# Patient Record
Sex: Male | Born: 1960 | Race: White | Hispanic: No | Marital: Married | Smoking: Current every day smoker
Health system: Southern US, Community
[De-identification: ages and names within clinical notes are randomized; demographics above are authoritative.]

## PROBLEM LIST (undated history)

## (undated) DIAGNOSIS — Z9289 Personal history of other medical treatment: Secondary | ICD-10-CM

## (undated) DIAGNOSIS — Z87442 Personal history of urinary calculi: Secondary | ICD-10-CM

---

## 1997-08-09 HISTORY — PX: HAND SURGERY: SHX662

## 1999-06-02 ENCOUNTER — Ambulatory Visit (HOSPITAL_BASED_OUTPATIENT_CLINIC_OR_DEPARTMENT_OTHER): Admission: RE | Admit: 1999-06-02 | Discharge: 1999-06-02 | Payer: Self-pay | Admitting: Orthopedic Surgery

## 2006-06-27 ENCOUNTER — Encounter: Admission: RE | Admit: 2006-06-27 | Discharge: 2006-06-27 | Payer: Self-pay | Admitting: Family Medicine

## 2008-12-18 ENCOUNTER — Encounter: Admission: RE | Admit: 2008-12-18 | Discharge: 2008-12-18 | Payer: Self-pay | Admitting: Family Medicine

## 2013-03-26 ENCOUNTER — Ambulatory Visit (INDEPENDENT_AMBULATORY_CARE_PROVIDER_SITE_OTHER): Payer: BC Managed Care – PPO | Admitting: Family Medicine

## 2013-03-26 VITALS — BP 120/78 | HR 86 | Temp 98.0°F | Resp 16 | Ht 69.0 in | Wt 135.0 lb

## 2013-03-26 DIAGNOSIS — M7710 Lateral epicondylitis, unspecified elbow: Secondary | ICD-10-CM

## 2013-03-26 DIAGNOSIS — M7712 Lateral epicondylitis, left elbow: Secondary | ICD-10-CM

## 2013-03-26 DIAGNOSIS — M25529 Pain in unspecified elbow: Secondary | ICD-10-CM

## 2013-03-26 MED ORDER — PREDNISONE 20 MG PO TABS: ORAL_TABLET | ORAL | Status: DC

## 2013-03-26 MED ORDER — METHYLPREDNISOLONE ACETATE 80 MG/ML IJ SUSP
80.0000 mg | Freq: Once | INTRAMUSCULAR | Status: AC
  Administered 2013-03-26: 80 mg via INTRA_ARTICULAR

## 2013-03-26 NOTE — Progress Notes (Signed)
51 [employer], left elbow pain worsening over lateral epicondyle for about10 days.  No trauma.  No change in activity.  No heavy lifting.  Aleve did not help  Objective:  NAD Mildly tender left lateral epicondyle.  No deformity.  Nl ROM Left elbow prepped with betadine and injected with 1 cc marcaine and 1/2 cc depomedrol.  Good relief.  No complications Assessment:  Lateral epicondylitis  Plan:  Tennis elbow syndrome, left - Plan: predniSONE (DELTASONE) 20 MG tablet, methylPREDNISolone acetate (DEPO-MEDROL) injection 80 mg  Signed, Elvina Sidle, MD

## 2013-03-26 NOTE — Patient Instructions (Addendum)
Tennis Elbow  Your caregiver has diagnosed you with a condition often referred to as "tennis elbow." This results from small tears or soreness (inflammation) at the start (origin) of the extensor muscles of the forearm. Although the condition is often called tennis or golfer's elbow, it is caused by any repetitive action performed by your elbow.  HOME CARE INSTRUCTIONS   If the condition has been short lived, rest may be the only treatment required. Using your opposite hand or arm to perform the task may help. Even changing your grip may help rest the extremity. These may even prevent the condition from recurring.   Longer standing problems, however, will often be relieved faster by:   Using anti-inflammatory agents.   Applying ice packs for 30 minutes at the end of the working day, at bed time, or when activities are finished.   Your caregiver may also have you wear a splint or sling. This will allow the inflamed tendon to heal.  At times, steroid injections aided with a local anesthetic will be required along with splinting for 1 to 2 weeks. Two to three steroid injections will often solve the problem. In some long standing cases, the inflamed tendon does not respond to conservative (non-surgical) therapy. Then surgery may be required to repair it.  MAKE SURE YOU:    Understand these instructions.   Will watch your condition.   Will get help right away if you are not doing well or get worse.  Document Released: 07/26/2005 Document Revised: 10/18/2011 Document Reviewed: 03/13/2008  [employer] Patient Information 2014 [employer].

## 2013-03-29 ENCOUNTER — Telehealth: Payer: Self-pay | Admitting: Radiology

## 2013-03-29 NOTE — Telephone Encounter (Signed)
erroneous encoutner

## 2017-05-19 ENCOUNTER — Encounter (HOSPITAL_COMMUNITY): Payer: Self-pay | Admitting: Emergency Medicine

## 2017-05-19 ENCOUNTER — Emergency Department (HOSPITAL_COMMUNITY)
Admission: EM | Admit: 2017-05-19 | Discharge: 2017-05-19 | Disposition: A | Discharge status: Home / Self Care | Payer: BLUE CROSS/BLUE SHIELD | Attending: Emergency Medicine | Admitting: Emergency Medicine

## 2017-05-19 ENCOUNTER — Emergency Department (HOSPITAL_COMMUNITY): Payer: BLUE CROSS/BLUE SHIELD

## 2017-05-19 DIAGNOSIS — F17200 Nicotine dependence, unspecified, uncomplicated: Secondary | ICD-10-CM | POA: Diagnosis not present

## 2017-05-19 DIAGNOSIS — N200 Calculus of kidney: Secondary | ICD-10-CM | POA: Insufficient documentation

## 2017-05-19 DIAGNOSIS — N3000 Acute cystitis without hematuria: Secondary | ICD-10-CM | POA: Insufficient documentation

## 2017-05-19 DIAGNOSIS — R109 Unspecified abdominal pain: Secondary | ICD-10-CM | POA: Diagnosis present

## 2017-05-19 LAB — CBC
HCT: 40.7 % (ref 39.0–52.0)
Hemoglobin: 14.2 g/dL (ref 13.0–17.0)
MCH: 31.9 pg (ref 26.0–34.0)
MCHC: 34.9 g/dL (ref 30.0–36.0)
MCV: 91.5 fL (ref 78.0–100.0)
PLATELETS: 155 10*3/uL (ref 150–400)
RBC: 4.45 MIL/uL (ref 4.22–5.81)
RDW: 13.7 % (ref 11.5–15.5)
WBC: 15 10*3/uL — ABNORMAL HIGH (ref 4.0–10.5)

## 2017-05-19 LAB — URINALYSIS, ROUTINE W REFLEX MICROSCOPIC
BILIRUBIN URINE: NEGATIVE
Glucose, UA: NEGATIVE mg/dL
Ketones, ur: NEGATIVE mg/dL
NITRITE: NEGATIVE
PH: 6 (ref 5.0–8.0)
Protein, ur: 100 mg/dL — AB
SPECIFIC GRAVITY, URINE: 1.011 (ref 1.005–1.030)

## 2017-05-19 LAB — BASIC METABOLIC PANEL
Anion gap: 12 (ref 5–15)
BUN: 24 mg/dL — AB (ref 6–20)
CALCIUM: 9.1 mg/dL (ref 8.9–10.3)
CO2: 26 mmol/L (ref 22–32)
CREATININE: 1.85 mg/dL — AB (ref 0.61–1.24)
Chloride: 99 mmol/L — ABNORMAL LOW (ref 101–111)
GFR calc Af Amer: 45 mL/min — ABNORMAL LOW (ref >60)
GFR, EST NON AFRICAN AMERICAN: 39 mL/min — AB (ref >60)
GLUCOSE: 122 mg/dL — AB (ref 65–99)
Potassium: 4 mmol/L (ref 3.5–5.1)
Sodium: 137 mmol/L (ref 135–145)

## 2017-05-19 MED ORDER — SODIUM CHLORIDE 0.9 % IV SOLN: INTRAVENOUS | Status: DC

## 2017-05-19 MED ORDER — CEFTRIAXONE SODIUM 1 G IJ SOLR
1.0000 g | Freq: Once | INTRAMUSCULAR | Status: AC
  Administered 2017-05-19: 1 g via INTRAVENOUS
  Filled 2017-05-19: qty 10

## 2017-05-19 MED ORDER — KETOROLAC TROMETHAMINE 30 MG/ML IJ SOLN
15.0000 mg | Freq: Once | INTRAMUSCULAR | Status: AC
  Administered 2017-05-19: 15 mg via INTRAVENOUS
  Filled 2017-05-19: qty 1

## 2017-05-19 NOTE — ED Triage Notes (Signed)
Pt complaint of hematuria and left flank pain onset Monday.

## 2017-05-19 NOTE — ED Provider Notes (Signed)
[facility]-EMERGENCY DEPT Provider Note   CSN: [id number] Arrival date & time: 05/19/17  1725     History   Chief Complaint Chief Complaint  Patient presents with  . Flank Pain    HPI Frederick Duke is a 56 y.o. male.  HPI Patient presents to the emergency room with complaints of left flank pain. Patient states symptoms started on Monday. He went to needle outpatient clinic and was told he had some blood in the urine as well as possibly some infection.  He was started on pain medications and antibiotics. Patient states his symptoms have persisted. He continues to have sharp pain in the left flank. It does not radiate. He's had some nausea but no vomiting. No abdominal pain. No testicular pain. No numbness or weakness. He does not have a history of kidney stones but thinks that could be what is going on. History reviewed. No pertinent past medical history.  There are no active problems to display for this patient.   Past Surgical History:  Procedure Laterality Date  . HAND SURGERY Right        Home Medications    Prior to Admission medications   Medication Sig Start Date End Date Taking? Authorizing Provider  ciprofloxacin (CIPRO) 500 MG tablet TK 1 T PO  BID 05/17/17  Yes [provider]  HYDROcodone-acetaminophen (NORCO/VICODIN) 5-325 MG tablet TK ONE T PO Q 6 H PRN PAIN 05/17/17  Yes [provider]  predniSONE (DELTASONE) 20 MG tablet 2 daily with food Patient not taking: Reported on 05/19/2017 03/26/13   Elvina Sidle, MD    Family History No family history on file.  Social History Social History  Substance Use Topics  . Smoking status: Current Every Day Smoker  . Smokeless tobacco: Not on file  . Alcohol use No     Allergies   Patient has no known allergies.   Review of Systems Review of Systems  All other systems reviewed and are negative.    Physical Exam Updated Vital Signs BP 130/90 (BP Location: Left Arm)   Pulse (!) 103    Temp 98 F (36.7 C) (Oral)   Resp 18   Ht 1.753 m ( )   Wt 59 kg (130 lb)   SpO2 96%   BMI 19.20 kg/m   Physical Exam  Constitutional: He appears well-developed and well-nourished. No distress.  HENT:  Head: Normocephalic and atraumatic.  Right Ear: External ear normal.  Left Ear: External ear normal.  Eyes: Conjunctivae are normal. Right eye exhibits no discharge. Left eye exhibits no discharge. No scleral icterus.  Neck: Neck supple. No tracheal deviation present.  Cardiovascular: Normal rate.   Pulmonary/Chest: Effort normal. No stridor. No respiratory distress.  Abdominal: Soft. Normal appearance. He exhibits no distension. There is no hepatosplenomegaly. There is no tenderness. There is CVA tenderness (left). No hernia.  Musculoskeletal: He exhibits no edema.  Neurological: He is alert. Cranial nerve deficit: no gross deficits.  Skin: Skin is warm and dry. No rash noted.  Psychiatric: He has a normal mood and affect.  Nursing note and vitals reviewed.    ED Treatments / Results  Labs (all labs ordered are listed, but only abnormal results are displayed) Labs Reviewed  URINALYSIS, ROUTINE W REFLEX MICROSCOPIC - Abnormal; Notable for the following:       Result Value   Hgb urine dipstick MODERATE (*)    Protein, ur 100 (*)    Leukocytes, UA LARGE (*)    Bacteria, UA  RARE (*)    Squamous Epithelial / LPF 0-5 (*)    All other components within normal limits  CBC - Abnormal; Notable for the following:    WBC 15.0 (*)    All other components within normal limits  BASIC METABOLIC PANEL - Abnormal; Notable for the following:    Chloride 99 (*)    Glucose, Bld 122 (*)    BUN 24 (*)    Creatinine, Ser 1.85 (*)    GFR calc non Af Amer 39 (*)    GFR calc Af Amer 45 (*)    All other components within normal limits    EKG  EKG Interpretation None       Radiology Ct Renal Stone Study  Result Date: 05/19/2017 CLINICAL DATA:  Pt complaint of hematuria and left  flank pain onset Monday EXAM: CT ABDOMEN AND PELVIS WITHOUT CONTRAST TECHNIQUE: Multidetector CT imaging of the abdomen and pelvis was performed following the standard protocol without IV contrast. COMPARISON:  None. FINDINGS: Lower chest: No acute abnormality. Hepatobiliary: Subcentimeter probable cyst in hepatic segment 7. No other liver lesion. Gallbladder is nondilated. No biliary ductal dilatation. Pancreas: Unremarkable. No pancreatic ductal dilatation or surrounding inflammatory changes. Spleen: Normal size.Scattered calcified granulomas. Adrenals/Urinary Tract: Normal adrenals. Moderate left hydronephrosis. 1.5 cm calculus at the left UPJ. The left ureter is ectatic distally. Bilateral nephrolithiasis. Urinary bladder is distended and yet mildly thick walled. Coarse calcification near the dome of the urinary bladder possibly associated with urachal remnant. Stomach/Bowel: Stomach and small bowel decompressed. Appendix not identified. The colon is unremarkable. Vascular/Lymphatic: No significant vascular findings are present. No enlarged abdominal or pelvic lymph nodes. Reproductive: Mild prostatic enlargement with central coarse calcifications. Other: No ascites.  No free air. Musculoskeletal: No acute or significant osseous findings. IMPRESSION: 1. Bilateral nephrolithiasis with obstructing 1.5 cm left UPJ calculus. 2. Distended thick-walled urinary bladder with distal left ureteral ectasia suggesting a degree of bladder outlet obstruction versus neurogenic bladder. Electronically Signed   By: Corlis Leak M.D.   On: 05/19/2017 18:52    Procedures Procedures (including critical care time)  Medications Ordered in ED Medications  ketorolac (TORADOL) 30 MG/ML injection 15 mg (not administered)  cefTRIAXone (ROCEPHIN) 1 g in dextrose 5 % 50 mL IVPB (not administered)     Initial Impression / Assessment and Plan / ED Course  I have reviewed the triage vital signs and the nursing notes.  Pertinent  labs & imaging results that were available during my care of the patient were reviewed by me and considered in my medical decision making (see chart for details).   Pt presented to the ED with persistent flank pain.  CT scan confirms a large UPJ stone.  Pt also has a uti but no fever or other systemic symptoms.  Clinically he appears well. Discussed with Dr Gearldine Shown.  Will have him follow up as an outpatient.  STrict return precautions given.  Final Clinical Impressions(s) / ED Diagnoses   Final diagnoses:  Acute cystitis without hematuria  Kidney stone    New Prescriptions New Prescriptions   No medications on file     Linwood Dibbles, MD 05/19/17 2012

## 2017-05-19 NOTE — Discharge Instructions (Signed)
Continue the antibiotics and medications for pain. He also can take ibuprofen or Naprosyn as needed. Call the urologist office to schedule appointment. The kidney stone that you have is unlikely to pass without additional treatment.

## 2017-05-20 ENCOUNTER — Observation Stay (HOSPITAL_COMMUNITY)
Admission: RE | Admit: 2017-05-20 | Discharge: 2017-05-21 | Disposition: A | Discharge status: Home / Self Care | Payer: BLUE CROSS/BLUE SHIELD | Attending: Urology | Admitting: Urology

## 2017-05-20 ENCOUNTER — Encounter (HOSPITAL_COMMUNITY): Admission: RE | Disposition: A | Discharge status: Home / Self Care | Payer: Self-pay | Attending: Urology

## 2017-05-20 ENCOUNTER — Ambulatory Visit (HOSPITAL_COMMUNITY): Payer: BLUE CROSS/BLUE SHIELD

## 2017-05-20 ENCOUNTER — Other Ambulatory Visit: Payer: Self-pay | Admitting: Urology

## 2017-05-20 ENCOUNTER — Ambulatory Visit (HOSPITAL_COMMUNITY): Payer: BLUE CROSS/BLUE SHIELD | Admitting: Anesthesiology

## 2017-05-20 ENCOUNTER — Observation Stay (HOSPITAL_COMMUNITY): Payer: BLUE CROSS/BLUE SHIELD

## 2017-05-20 ENCOUNTER — Encounter (HOSPITAL_COMMUNITY): Payer: Self-pay | Admitting: *Deleted

## 2017-05-20 DIAGNOSIS — N390 Urinary tract infection, site not specified: Secondary | ICD-10-CM | POA: Diagnosis not present

## 2017-05-20 DIAGNOSIS — F17210 Nicotine dependence, cigarettes, uncomplicated: Secondary | ICD-10-CM | POA: Insufficient documentation

## 2017-05-20 DIAGNOSIS — N132 Hydronephrosis with renal and ureteral calculous obstruction: Secondary | ICD-10-CM | POA: Diagnosis present

## 2017-05-20 DIAGNOSIS — N201 Calculus of ureter: Secondary | ICD-10-CM | POA: Diagnosis present

## 2017-05-20 HISTORY — DX: Personal history of urinary calculi: Z87.442

## 2017-05-20 HISTORY — PX: CYSTOSCOPY W/ URETERAL STENT PLACEMENT: SHX1429

## 2017-05-20 SURGERY — CYSTOSCOPY, FLEXIBLE, WITH STENT REPLACEMENT
Anesthesia: General | Laterality: Left

## 2017-05-20 MED ORDER — ONDANSETRON HCL 4 MG/2ML IJ SOLN
INTRAMUSCULAR | Status: AC
  Filled 2017-05-20: qty 2

## 2017-05-20 MED ORDER — DEXTROSE 5 % IV SOLN: 1.0000 g | INTRAVENOUS | Status: DC

## 2017-05-20 MED ORDER — FENTANYL CITRATE (PF) 100 MCG/2ML IJ SOLN: 25.0000 ug | INTRAMUSCULAR | Status: DC | PRN

## 2017-05-20 MED ORDER — ONDANSETRON HCL 4 MG/2ML IJ SOLN: 4.0000 mg | INTRAMUSCULAR | Status: DC | PRN

## 2017-05-20 MED ORDER — PHENYLEPHRINE 40 MCG/ML (10ML) SYRINGE FOR IV PUSH (FOR BLOOD PRESSURE SUPPORT)
PREFILLED_SYRINGE | INTRAVENOUS | Status: DC | PRN
  Administered 2017-05-20 (×3): 80 ug via INTRAVENOUS

## 2017-05-20 MED ORDER — MIDAZOLAM HCL 2 MG/2ML IJ SOLN
INTRAMUSCULAR | Status: AC
  Filled 2017-05-20: qty 2

## 2017-05-20 MED ORDER — DEXTROSE 5 % IV SOLN
2.0000 g | Freq: Once | INTRAVENOUS | Status: AC
  Administered 2017-05-20: 2 g via INTRAVENOUS
  Filled 2017-05-20: qty 2

## 2017-05-20 MED ORDER — PROPOFOL 10 MG/ML IV BOLUS
INTRAVENOUS | Status: AC
  Filled 2017-05-20: qty 20

## 2017-05-20 MED ORDER — FENTANYL CITRATE (PF) 100 MCG/2ML IJ SOLN
INTRAMUSCULAR | Status: AC
  Filled 2017-05-20: qty 2

## 2017-05-20 MED ORDER — DEXAMETHASONE SODIUM PHOSPHATE 10 MG/ML IJ SOLN
INTRAMUSCULAR | Status: DC | PRN
  Administered 2017-05-20: 10 mg via INTRAVENOUS

## 2017-05-20 MED ORDER — HYDROCODONE-ACETAMINOPHEN 5-325 MG PO TABS: 1.0000 | ORAL_TABLET | ORAL | Status: DC | PRN

## 2017-05-20 MED ORDER — LACTATED RINGERS IV SOLN
INTRAVENOUS | Status: DC
  Administered 2017-05-20: 18:00:00 via INTRAVENOUS

## 2017-05-20 MED ORDER — ONDANSETRON HCL 4 MG/2ML IJ SOLN
INTRAMUSCULAR | Status: DC | PRN
  Administered 2017-05-20: 4 mg via INTRAVENOUS

## 2017-05-20 MED ORDER — IOHEXOL 300 MG/ML  SOLN
INTRAMUSCULAR | Status: DC | PRN
  Administered 2017-05-20: 5 mL via URETHRAL

## 2017-05-20 MED ORDER — DIPHENHYDRAMINE HCL 12.5 MG/5ML PO ELIX: 12.5000 mg | ORAL_SOLUTION | Freq: Four times a day (QID) | ORAL | Status: DC | PRN

## 2017-05-20 MED ORDER — DEXAMETHASONE SODIUM PHOSPHATE 10 MG/ML IJ SOLN
INTRAMUSCULAR | Status: AC
  Filled 2017-05-20: qty 1

## 2017-05-20 MED ORDER — SODIUM CHLORIDE 0.9 % IR SOLN
Status: DC | PRN
  Administered 2017-05-20: 3000 mL

## 2017-05-20 MED ORDER — FENTANYL CITRATE (PF) 100 MCG/2ML IJ SOLN
INTRAMUSCULAR | Status: DC | PRN
  Administered 2017-05-20: 100 ug via INTRAVENOUS

## 2017-05-20 MED ORDER — DOCUSATE SODIUM 100 MG PO CAPS
100.0000 mg | ORAL_CAPSULE | Freq: Two times a day (BID) | ORAL | Status: DC
Start: 2017-05-20 — End: 2017-05-21
  Administered 2017-05-20: 100 mg via ORAL
  Filled 2017-05-20: qty 1

## 2017-05-20 MED ORDER — KCL IN DEXTROSE-NACL 20-5-0.45 MEQ/L-%-% IV SOLN
INTRAVENOUS | Status: DC
  Administered 2017-05-20: 20:00:00 via INTRAVENOUS
  Filled 2017-05-20 (×2): qty 1000

## 2017-05-20 MED ORDER — LIDOCAINE 2% (20 MG/ML) 5 ML SYRINGE
INTRAMUSCULAR | Status: DC | PRN
  Administered 2017-05-20: 100 mg via INTRAVENOUS

## 2017-05-20 MED ORDER — PROPOFOL 10 MG/ML IV BOLUS
INTRAVENOUS | Status: DC | PRN
  Administered 2017-05-20: 180 mg via INTRAVENOUS

## 2017-05-20 MED ORDER — PHENYLEPHRINE 40 MCG/ML (10ML) SYRINGE FOR IV PUSH (FOR BLOOD PRESSURE SUPPORT)
PREFILLED_SYRINGE | INTRAVENOUS | Status: AC
  Filled 2017-05-20: qty 10

## 2017-05-20 MED ORDER — LIDOCAINE 2% (20 MG/ML) 5 ML SYRINGE
INTRAMUSCULAR | Status: AC
  Filled 2017-05-20: qty 5

## 2017-05-20 MED ORDER — DIPHENHYDRAMINE HCL 50 MG/ML IJ SOLN: 12.5000 mg | Freq: Four times a day (QID) | INTRAMUSCULAR | Status: DC | PRN

## 2017-05-20 MED ORDER — MIDAZOLAM HCL 5 MG/5ML IJ SOLN
INTRAMUSCULAR | Status: DC | PRN
  Administered 2017-05-20: 2 mg via INTRAVENOUS

## 2017-05-20 SURGICAL SUPPLY — 13 items
BAG URO CATCHER STRL LF (MISCELLANEOUS) ×3 IMPLANT
CATH INTERMIT  6FR 70CM (CATHETERS) ×3 IMPLANT
CLOTH BEACON ORANGE TIMEOUT ST (SAFETY) ×3 IMPLANT
COVER FOOTSWITCH UNIV (MISCELLANEOUS) IMPLANT
COVER SURGICAL LIGHT HANDLE (MISCELLANEOUS) ×3 IMPLANT
GLOVE BIOGEL M STRL SZ7.5 (GLOVE) ×7 IMPLANT
GOWN STRL REUS W/TWL LRG LVL3 (GOWN DISPOSABLE) ×6 IMPLANT
GUIDEWIRE STR DUAL SENSOR (WIRE) ×3 IMPLANT
MANIFOLD NEPTUNE II (INSTRUMENTS) ×3 IMPLANT
PACK CYSTO (CUSTOM PROCEDURE TRAY) ×3 IMPLANT
STENT CONTOUR 6FRX24X.038 (STENTS) ×2 IMPLANT
TUBING CONNECTING 10 (TUBING) ×2 IMPLANT
TUBING CONNECTING 10' (TUBING) ×1

## 2017-05-20 NOTE — Transfer of Care (Signed)
Immediate Anesthesia Transfer of Care Note  Patient: Frederick Duke  Procedure(s) Performed: Procedure(s): CYSTOSCOPY WITH STENT REPLACEMENT, RETROGRADE (Left)  Patient Location: PACU  Anesthesia Type:General  Level of Consciousness:  alert, patient cooperative and responds to stimulation  Airway & Oxygen Therapy:Patient Spontanous Breathing and Patient connected to face mask oxgen  Post-op Assessment:  Report given to PACU RN and Post -op Vital signs reviewed and stable  Post vital signs:  Reviewed and stable  Last Vitals:  Vitals:   05/20/17 1727  BP: 126/87  Pulse: (!) 103  Resp: 16  Temp: 37.1 C  SpO2: 99%    Complications: No apparent anesthesia complications

## 2017-05-20 NOTE — Anesthesia Preprocedure Evaluation (Addendum)
Anesthesia Evaluation  Patient identified by MRN, date of birth, ID band Patient awake    Reviewed: Allergy & Precautions, H&P , NPO status , Patient's Chart, lab work & pertinent test results  Airway Mallampati: II  TM Distance: >3 FB Neck ROM: Full    Dental no notable dental hx. (+) Poor Dentition, Dental Advisory Given   Pulmonary Current Smoker,    Pulmonary exam normal breath sounds clear to auscultation       Cardiovascular negative cardio ROS   Rhythm:Regular Rate:Normal     Neuro/Psych negative neurological ROS  negative psych ROS   GI/Hepatic negative GI ROS, Neg liver ROS,   Endo/Other  negative endocrine ROS  Renal/GU negative Renal ROS  negative genitourinary   Musculoskeletal   Abdominal   Peds  Hematology negative hematology ROS (+)   Anesthesia Other Findings   Reproductive/Obstetrics negative OB ROS                            Anesthesia Physical Anesthesia Plan  ASA: II  Anesthesia Plan: General   Post-op Pain Management:    Induction: Intravenous  PONV Risk Score and Plan: 2 and Ondansetron, Dexamethasone and Midazolam  Airway Management Planned: LMA  Additional Equipment:   Intra-op Plan:   Post-operative Plan: Extubation in OR  Informed Consent: I have reviewed the patients History and Physical, chart, labs and discussed the procedure including the risks, benefits and alternatives for the proposed anesthesia with the patient or authorized representative who has indicated his/her understanding and acceptance.   Dental advisory given  Plan Discussed with: CRNA  Anesthesia Plan Comments:         Anesthesia Quick Evaluation

## 2017-05-20 NOTE — H&P (Signed)
Office Visit Report     05/20/2017   --------------------------------------------------------------------------------   Frederick Duke  MRN: [id number]  PRIMARY CARE:    DOB: Aug 06, 1961, 56 year old Male  REFERRING:  Linwood Dibbles, MD  SSN: [id number]  PROVIDER:  Heloise Purpura, M.D.    LOCATION:  [facility] [phone]   --------------------------------------------------------------------------------   CC/HPI: Left ureteral and renal calculi   [name] is an otherwise healthy 56 year old gentleman who developed the acute onset of severe left-sided flank pain beginning on Monday. He was seen by his primary care office on Tuesday and was administered pain medication and ciprofloxacin for presumed UTI and possible kidney stone. He has since had some low-grade fevers up to 99.9. He has had associated nausea and vomiting and his pain has been intermittent. His pain worsened on Thursday causing him to present to the emergency department where he underwent a CT stone study. This confirmed multiple nonobstructing left renal calculi along with a 1.5 cm left UPJ calculus. I reviewed the CT scan. Hounsfield units measure 750. He has no prior history of urolithiasis. His white blood count in the emergency department was 15,000. His serum creatinine was 1.85. Urinalysis demonstrated rare bacteria.     ALLERGIES: None   MEDICATIONS: Cipro PO Daily  Hydrocodone-Acetaminophen     GU PSH: None   NON-GU PSH: Hand/finger Surgery, Right    GU PMH: None   NON-GU PMH: None   FAMILY HISTORY: prostate - Father   SOCIAL HISTORY: Marital Status: Married Preferred Language: English; Ethnicity: Not Hispanic Or Latino; Race: White Current Smoking Status: Patient does not smoke anymore. Smoked for 35 years. Smoked 1 pack per day.   Tobacco Use Assessment Completed: Used Tobacco in last 30 days? Has never drank.  Drinks 2 caffeinated drinks per day.    REVIEW OF SYSTEMS:    GU  Review Male:   Patient denies frequent urination, hard to postpone urination, burning/ pain with urination, get up at night to urinate, leakage of urine, stream starts and stops, trouble starting your streams, and have to strain to urinate .  Gastrointestinal (Upper):   Patient reports vomiting. Patient denies nausea.  Gastrointestinal (Lower):   Patient denies diarrhea and constipation.  Constitutional:   Patient reports fever, night sweats, weight loss, and fatigue.   Skin:   Patient denies skin rash/ lesion and itching.  Eyes:   Patient denies blurred vision and double vision.  Ears/ Nose/ Throat:   Patient denies sore throat and sinus problems.  Hematologic/Lymphatic:   Patient denies swollen glands and easy bruising.  Cardiovascular:   Patient denies leg swelling and chest pains.  Respiratory:   Patient denies cough and shortness of breath.  Endocrine:   Patient denies excessive thirst.  Musculoskeletal:   Patient reports back pain. Patient denies joint pain.  Neurological:   Patient denies headaches and dizziness.  Psychologic:   Patient denies depression and anxiety.   VITAL SIGNS:      05/20/2017 03:58 PM  Weight 130 lb / 58.97 kg  Height 69 in / 175.26 cm  BP 119/76 mmHg  Pulse 105 /min  Temperature 99.1 F / 37.2 C  BMI 19.2 kg/m   MULTI-SYSTEM PHYSICAL EXAMINATION:    Constitutional: Well-nourished. No physical deformities. Normally developed. Good grooming.  Neck: Neck symmetrical, not swollen. Normal tracheal position.  Respiratory: No labored breathing, no use of accessory muscles. Clear bilaterally.  Cardiovascular: Normal temperature, normal extremity pulses, no swelling, no varicosities. Regular rate  and rhythm.  Lymphatic: No enlargement of neck, axillae, groin.  Skin: No paleness, no jaundice, no cyanosis. No lesion, no ulcer, no rash.  Neurologic / Psychiatric: Oriented to time, oriented to place, oriented to person. No depression, no anxiety, no agitation.   Gastrointestinal: No mass, no tenderness, no rigidity, non obese abdomen. No CVA tenderness.  Eyes: Normal conjunctivae. Normal eyelids.  Ears, Nose, Mouth, and Throat: Left ear no scars, no lesions, no masses. Right ear no scars, no lesions, no masses. Nose no scars, no lesions, no masses. Normal hearing. Normal lips.  Musculoskeletal: Normal gait and station of head and neck.     PAST DATA REVIEWED:  Source Of History:  Patient  Lab Test Review:   BMP, CBC with Diff  Records Review:   Previous Patient Records  Urine Test Review:   Urinalysis  X-Ray Review: C.T. Abdomen/Pelvis: Reviewed Films.    Notes:                     Creatinine 1.85   PROCEDURES:          Urinalysis w/Scope Dipstick Dipstick Cont'd Micro  Color: Amber Bilirubin: Neg WBC/hpf: >60/hpf  Appearance: Cloudy Ketones: Trace RBC/hpf: 10 - 20/hpf  Specific Gravity: 1.025 Blood: 2+ Bacteria: Many (>50/hpf)  pH: 6.0 Protein: 2+ Cystals: NS (Not Seen)  Glucose: Neg Urobilinogen: 0.2 Casts: NS (Not Seen)    Nitrites: Neg Trichomonas: Not Present    Leukocyte Esterase: 3+ Mucous: Not Present      Epithelial Cells: 0 - 5/hpf      Yeast: NS (Not Seen)      Sperm: Not Present    ASSESSMENT:      ICD-10 Details  1 GU:   Ureteral calculus - N20.1    PLAN:           Orders Labs Urine Culture          Schedule Return Visit/Planned Activity: Other See Visit Notes             Note: Follow-up will be scheduled per surgery scheduler's following procedure.          Document Letter(s):  Created for Patient: Clinical Summary         Notes:   1. Large left UPJ calculus with multiple nonobstructing left renal calculi and UTI: I recommended proceeding with cystoscopy and ureteral stent placement this evening considering his leukocytosis and urinalysis that appears infected. He will be treated with antibiotic therapy and admitted for overnight observation. His urine has been cultured today. He will then need definitive  treatment of his left UPJ calculus and we will further discuss appropriate options for treatment in the future. I will perform a KUB x-ray while he is in the hospital. We discussed this plan procedure in detail including potential risks, complications, and expected recovery process. He gives informed consent to proceed.   Cc:

## 2017-05-20 NOTE — Anesthesia Postprocedure Evaluation (Signed)
Anesthesia Post Note  Patient: Frederick Duke  Procedure(s) Performed: CYSTOSCOPY WITH STENT REPLACEMENT, RETROGRADE (Left )     Patient location during evaluation: PACU Anesthesia Type: General Level of consciousness: awake and alert Pain management: pain level controlled Vital Signs Assessment: post-procedure vital signs reviewed and stable Respiratory status: spontaneous breathing, nonlabored ventilation, respiratory function stable and patient connected to nasal cannula oxygen Cardiovascular status: blood pressure returned to baseline and stable Postop Assessment: no apparent nausea or vomiting Anesthetic complications: no    Last Vitals:  Vitals:   05/20/17 1915 05/20/17 1930  BP: 104/76 104/72  Pulse: 78 84  Resp: 15 15  Temp:  36.6 C  SpO2: 100% 100%    Last Pain:  Vitals:   05/20/17 1727  TempSrc: Oral                 Camesha Farooq,W. EDMOND

## 2017-05-20 NOTE — Op Note (Signed)
Preoperative diagnosis:  1. Left ureteral stone and UTI   Postoperative diagnosis:  1. Left ureteral stone and UTI   Procedure:  1. Cystoscopy 2. Left ureteral stent placement (6 x 24) no string 3. Left retrograde pyelography with interpretation   Surgeon: Moody Bruins. M.D.  Anesthesia: General  Complications: None  Intraoperative findings: Left retrograde pyelography was performed with a 6 Fr ureteral cathter and omnipaque contrast.  He was noted to have dilation of his renal collecting system and a filling defect of the proximal ureter and lower pole of the renal collecting system.  EBL: Minimal  Specimens: None  Indication: Frederick Duke is a 56 y.o. patient with a left ureteral stone and UTI. After reviewing the management options for treatment, he elected to proceed with the above surgical procedure(s). We have discussed the potential benefits and risks of the procedure, side effects of the proposed treatment, the likelihood of the patient achieving the goals of the procedure, and any potential problems that might occur during the procedure or recuperation. Informed consent has been obtained.  Description of procedure:  The patient was taken to the operating room and general anesthesia was induced.  The patient was placed in the dorsal lithotomy position, prepped and draped in the usual sterile fashion, and preoperative antibiotics were administered. A preoperative time-out was performed.   Cystourethroscopy was performed.  The patient's urethra was examined and was normal. The bladder was then systematically examined in its entirety. There was no evidence for any bladder tumors, stones, or other mucosal pathology.    Attention then turned to the left ureteral orifice and a ureteral catheter was used to intubate the ureteral orifice.  Omnipaque contrast was injected through the ureteral catheter and a retrograde pyelogram was performed with findings as dictated  above.  A 0.38 sensor guidewire was then advanced up the left ureter into the renal pelvis under fluoroscopic guidance.  The wire was then backloaded through the cystoscope and a ureteral stent was advance over the wire using Seldinger technique.  The stent was positioned appropriately under fluoroscopic and cystoscopic guidance.  The wire was then removed with an adequate stent curl noted in the renal pelvis as well as in the bladder.  The bladder was then emptied and the procedure ended.  The patient appeared to tolerate the procedure well and without complications.  The patient was able to be awakened and transferred to the recovery unit in satisfactory condition.    Moody Bruins MD

## 2017-05-20 NOTE — Anesthesia Procedure Notes (Signed)
Procedure Name: Intubation Date/Time: 05/20/2017 6:30 PM Performed by: Enriqueta Shutter D Pre-anesthesia Checklist: Patient identified, Emergency Drugs available, Suction available and Patient being monitored Patient Re-evaluated:Patient Re-evaluated prior to induction Oxygen Delivery Method: Circle system utilized Preoxygenation: Pre-oxygenation with 100% oxygen Induction Type: IV induction Ventilation: Mask ventilation without difficulty LMA: LMA inserted LMA Size: 4.0 Tube type: Oral Number of attempts: 1 Placement Confirmation: positive ETCO2 and breath sounds checked- equal and bilateral Tube secured with: Tape Dental Injury: Teeth and Oropharynx as per pre-operative assessment

## 2017-05-21 ENCOUNTER — Encounter (HOSPITAL_COMMUNITY): Payer: Self-pay | Admitting: Urology

## 2017-05-21 DIAGNOSIS — N132 Hydronephrosis with renal and ureteral calculous obstruction: Secondary | ICD-10-CM | POA: Diagnosis not present

## 2017-05-21 LAB — CBC
HEMATOCRIT: 38.2 % — AB (ref 39.0–52.0)
HEMOGLOBIN: 12.8 g/dL — AB (ref 13.0–17.0)
MCH: 30.8 pg (ref 26.0–34.0)
MCHC: 33.5 g/dL (ref 30.0–36.0)
MCV: 92 fL (ref 78.0–100.0)
Platelets: 179 10*3/uL (ref 150–400)
RBC: 4.15 MIL/uL — AB (ref 4.22–5.81)
RDW: 14 % (ref 11.5–15.5)
WBC: 7.3 10*3/uL (ref 4.0–10.5)

## 2017-05-21 LAB — BASIC METABOLIC PANEL
ANION GAP: 10 (ref 5–15)
BUN: 28 mg/dL — ABNORMAL HIGH (ref 6–20)
CALCIUM: 8.9 mg/dL (ref 8.9–10.3)
CO2: 23 mmol/L (ref 22–32)
CREATININE: 1.03 mg/dL (ref 0.61–1.24)
Chloride: 109 mmol/L (ref 101–111)
Glucose, Bld: 172 mg/dL — ABNORMAL HIGH (ref 65–99)
Potassium: 4.6 mmol/L (ref 3.5–5.1)
SODIUM: 142 mmol/L (ref 135–145)

## 2017-05-21 MED ORDER — SENNA 8.6 MG PO TABS: 1.0000 | ORAL_TABLET | Freq: Every day | ORAL | 0 refills | Status: AC

## 2017-05-21 MED ORDER — HYDROCODONE-ACETAMINOPHEN 5-325 MG PO TABS: 1.0000 | ORAL_TABLET | ORAL | 0 refills | Status: DC | PRN

## 2017-05-21 MED ORDER — TAMSULOSIN HCL 0.4 MG PO CAPS: 0.4000 mg | ORAL_CAPSULE | Freq: Every day | ORAL | 0 refills | Status: AC

## 2017-05-21 MED ORDER — DOCUSATE SODIUM 100 MG PO CAPS: 100.0000 mg | ORAL_CAPSULE | Freq: Two times a day (BID) | ORAL | 0 refills | Status: DC

## 2017-05-21 NOTE — Progress Notes (Signed)
Patient ID: Frederick Duke, male   DOB: 01/28/1961, 56 y.o.   MRN: [id number]  1 Day Post-Op Subjective: Pt doing well.  Minimal pain.  No fever overnight.  Objective: Vital signs in last 24 hours: Temp:  [97.9 F (36.6 C)-98.8 F (37.1 C)] 98 F (36.7 C) (10/13 0443) Pulse Rate:  [78-103] 80 (10/13 0443) Resp:  [14-16] 16 (10/13 0443) BP: (104-126)/(68-87) 105/68 (10/13 0443) SpO2:  [97 %-100 %] 97 % (10/13 0443) Weight:  [55.5 kg (122 lb 5.7 oz)-56.5 kg (124 lb 9.6 oz)] 55.5 kg (122 lb 5.7 oz) (10/12 1946)  Intake/Output from previous day: 10/12 0701 - 10/13 0700 In: 1431.3 [P.O.:240; I.V.:1191.3] Out: 475 [Urine:475] Intake/Output this shift: No intake/output data recorded.  Physical Exam:  General: Alert and oriented Abdomen: Soft, ND, No CVAT   Lab Results:  Recent Labs  05/19/17 1807 05/21/17 0522  HGB 14.2 12.8*  HCT 40.7 38.2*   CBC Latest Ref Rng & Units 05/21/2017 05/19/2017  WBC 4.0 - 10.5 K/uL 7.3 15.0(H)  Hemoglobin 13.0 - 17.0 g/dL 12.8(L) 14.2  Hematocrit 39.0 - 52.0 % 38.2(L) 40.7  Platelets 150 - 400 K/uL 179 155     BMET  Recent Labs  05/19/17 1807 05/21/17 0522  NA 137 142  K 4.0 4.6  CL 99* 109  CO2 26 23  GLUCOSE 122* 172*  BUN 24* 28*  CREATININE 1.85* 1.03  CALCIUM 9.1 8.9     Studies/Results: Abdomen 1 View (kub)  Result Date: 05/20/2017 CLINICAL DATA:  Status post cystoscopy. Known left ureteral stone. Initial encounter. EXAM: ABDOMEN - 1 VIEW COMPARISON:  CT of the abdomen and pelvis from 05/19/2017 FINDINGS: A left ureteral stent is noted overlying expected position. Residual contrast is noted at the left renal calyces. The known large left ureteral stone is not well characterized on this study. The visualized bowel gas pattern is grossly unremarkable. No acute osseous abnormalities are seen. IMPRESSION: Left ureteral stent overlies expected position. Residual contrast at the left renal calyces. Known large left ureteral  stone is not well characterized on this study. Electronically Signed   By: Roanna Raider M.D.   On: 05/20/2017 22:34   Dg C-arm 1-60 Min-no Report  Result Date: 05/20/2017 Fluoroscopy was utilized by the requesting physician.  No radiographic interpretation.   Ct Renal Stone Study  Result Date: 05/19/2017 CLINICAL DATA:  Pt complaint of hematuria and left flank pain onset Monday EXAM: CT ABDOMEN AND PELVIS WITHOUT CONTRAST TECHNIQUE: Multidetector CT imaging of the abdomen and pelvis was performed following the standard protocol without IV contrast. COMPARISON:  None. FINDINGS: Lower chest: No acute abnormality. Hepatobiliary: Subcentimeter probable cyst in hepatic segment 7. No other liver lesion. Gallbladder is nondilated. No biliary ductal dilatation. Pancreas: Unremarkable. No pancreatic ductal dilatation or surrounding inflammatory changes. Spleen: Normal size.Scattered calcified granulomas. Adrenals/Urinary Tract: Normal adrenals. Moderate left hydronephrosis. 1.5 cm calculus at the left UPJ. The left ureter is ectatic distally. Bilateral nephrolithiasis. Urinary bladder is distended and yet mildly thick walled. Coarse calcification near the dome of the urinary bladder possibly associated with urachal remnant. Stomach/Bowel: Stomach and small bowel decompressed. Appendix not identified. The colon is unremarkable. Vascular/Lymphatic: No significant vascular findings are present. No enlarged abdominal or pelvic lymph nodes. Reproductive: Mild prostatic enlargement with central coarse calcifications. Other: No ascites.  No free air. Musculoskeletal: No acute or significant osseous findings. IMPRESSION: 1. Bilateral nephrolithiasis with obstructing 1.5 cm left UPJ calculus. 2. Distended thick-walled urinary bladder with distal left ureteral  ectasia suggesting a degree of bladder outlet obstruction versus neurogenic bladder. Electronically Signed   By: Corlis Leak M.D.   On: 05/19/2017 18:52     Assessment/Plan: Left UPJ stone and renal calculi with UTI - Continue empiric ciprofloxacin pending cultures.  Will d/c home today.  Will arrange outpatient f/u.  Stones not well visualized on KUB.  Will need PCNL vs staged ureteroscopy most likely.   LOS: 0 days   Nyeema Want,LES 05/21/2017, 8:05 AM

## 2017-05-21 NOTE — Progress Notes (Signed)
Reviewed discharge information with patient and caregiver. Answered all questions. Patient/caregiver able to teach back medications and reasons to contact MD/911. Patient verbalizes importance of PCP follow up appointment. Patient voiding well and denies pain. Provided with urology office number for follow up appointment.  Earnest Conroy. Clelia Croft, RN

## 2017-05-23 NOTE — Discharge Summary (Signed)
  Date of admission: 05/20/2017  Date of discharge: 05/21/17  Admission diagnosis: Left Ureteral Calculus  Discharge diagnosis: Left Ureteral Calculus  History and Physical: For full details, please see admission history and physical. Briefly, Frederick Duke is a 56 y.o. year old patient with a large left UPJ calculus and left renal calculi. She did have evidence of a urinary tract infection and was therefore recommended to undergo ureteral stentingon 05/20/17 when he presented.   Hospital Course: He underwent left ureteral stent placement on 05/20/17.  He remained afebrile overnight and was discharged home on empiric ciprofloxacin.  Laboratory values:  Recent Labs  05/21/17 0522  HGB 12.8*  HCT 38.2*    Recent Labs  05/21/17 0522  CREATININE 1.03    Disposition: Home  Discharge medications:  Allergies as of 05/21/2017   No Known Allergies     Medication List    TAKE these medications   ciprofloxacin 500 MG tablet Commonly known as:  CIPRO take  by mouth twice daily   docusate sodium 100 MG capsule Commonly known as:  COLACE Take 1 capsule (100 mg total) by mouth 2 (two) times daily.   HYDROcodone-acetaminophen 5-325 MG tablet Commonly known as:  NORCO/VICODIN Take 1 tablet by mouth every 4 (four) hours as needed for moderate pain. What changed:  See the new instructions.   senna 8.6 MG Tabs tablet Commonly known as:  SENOKOT Take 1-2 tablets (8.6-17.2 mg total) by mouth at bedtime.   tamsulosin 0.4 MG Caps capsule Commonly known as:  FLOMAX Take 1 capsule (0.4 mg total) by mouth at bedtime.       Followup:  he will follow-up with me in the next 2 weeks for further evaluation to assess resolution of his infection. His stones were not easy to identify on KUB imaging. He likely will be best served with a percutaneous nephrostolithotomy or treatment considering his large size and large burden left renal calculi and left UPJ stone.

## 2017-06-07 ENCOUNTER — Other Ambulatory Visit (HOSPITAL_COMMUNITY): Payer: Self-pay | Admitting: Urology

## 2017-06-07 ENCOUNTER — Other Ambulatory Visit: Payer: Self-pay | Admitting: Urology

## 2017-06-07 DIAGNOSIS — N200 Calculus of kidney: Secondary | ICD-10-CM

## 2017-06-21 NOTE — Progress Notes (Addendum)
05-20-17 (EPIC) DG Abd/1 View  06-22-17 CBC result routed to Dr. Laverle PatterBorden for review

## 2017-06-21 NOTE — Patient Instructions (Addendum)
Frederick RobesJoseph L Duke  06/21/2017   Your procedure is scheduled on: 06-27-17  [facility]  Entrance to Radiology at 9:30 AM   Call this number if you have problems the morning of surgery [phone]    Remember: ONLY 1 PERSON MAY GO WITH YOU TO SHORT STAY TO GET  READY MORNING OF YOUR SURGERY.  Do not eat food or drink liquids :After Midnight. You may have a Clear Liquid Diet from Midnight until 7:00 AM. After 7:00 AM, nothing until after surgery.      CLEAR LIQUID DIET   Foods Allowed                                                                     Foods Excluded  Coffee and tea, regular and decaf                             liquids that you cannot  Plain Jell-O in any flavor                                             see through such as: Fruit ices (not with fruit pulp)                                     milk, soups, orange juice  Iced Popsicles                                    All solid food Carbonated beverages, regular and diet                                    Cranberry, grape and apple juices Sports drinks like Gatorade Lightly seasoned clear broth or consume(fat free) Sugar, honey syrup  Sample Menu Breakfast                                Lunch                                     Supper Cranberry juice                    Beef broth                            Chicken broth Jell-O                                     Grape juice  Apple juice Coffee or tea                        Jell-O                                      Popsicle                                                Coffee or tea                        Coffee or tea  _____________________________________________________________________    Take these medicines the morning of surgery with A SIP OF WATER: None                                You may not have any metal on your body including hair pins and              piercings  Do not wear  jewelry, lotions, powders , deodorant             Men may shave face and neck.   Do not bring valuables to the hospital. [facility] IS NOT             RESPONSIBLE   FOR VALUABLES.  Contacts, dentures or bridgework may not be worn into surgery.  Leave suitcase in the car. After surgery it may be brought to your room.                  Please read over the following fact sheets you were given: _____________________________________________________________________             [facility] - Preparing for Surgery Before surgery, you can play an important role.  Because skin is not sterile, your skin needs to be as free of germs as possible.  You can reduce the number of germs on your skin by washing with CHG (chlorahexidine gluconate) soap before surgery.  CHG is an antiseptic cleaner which kills germs and bonds with the skin to continue killing germs even after washing. Please DO NOT use if you have an allergy to CHG or antibacterial soaps.  If your skin becomes reddened/irritated stop using the CHG and inform your nurse when you arrive at Short Stay. Do not shave (including legs and underarms) for at least 48 hours prior to the first CHG shower.  You may shave your face/neck. Please follow these instructions carefully:  1.  Shower with CHG Soap the night before surgery and the  morning of Surgery.  2.  If you choose to wash your hair, wash your hair first as usual with your  normal  shampoo.  3.  After you shampoo, rinse your hair and body thoroughly to remove the  shampoo.                           4.  Use CHG as you would any other liquid soap.  You can apply chg directly  to the skin and wash  Gently with a scrungie or clean washcloth.  5.  Apply the CHG Soap to your body ONLY FROM THE NECK DOWN.   Do not use on face/ open                           Wound or open sores. Avoid contact with eyes, ears mouth and genitals (private parts).                       Wash face,   Genitals (private parts) with your normal soap.             6.  Wash thoroughly, paying special attention to the area where your surgery  will be performed.  7.  Thoroughly rinse your body with warm water from the neck down.  8.  DO NOT shower/wash with your normal soap after using and rinsing off  the CHG Soap.                9.  Pat yourself dry with a clean towel.            10.  Wear clean pajamas.            11.  Place clean sheets on your bed the night of your first shower and do not  sleep with pets. Day of Surgery : Do not apply any lotions/deodorants the morning of surgery.  Please wear clean clothes to the hospital/surgery center.  FAILURE TO FOLLOW THESE INSTRUCTIONS MAY RESULT IN THE CANCELLATION OF YOUR SURGERY PATIENT SIGNATURE_________________________________  NURSE SIGNATURE__________________________________  ________________________________________________________________________  WHAT IS A BLOOD TRANSFUSION? Blood Transfusion Information  A transfusion is the replacement of blood or some of its parts. Blood is made up of multiple cells which provide different functions.  Red blood cells carry oxygen and are used for blood loss replacement.  White blood cells fight against infection.  Platelets control bleeding.  Plasma helps clot blood.  Other blood products are available for specialized needs, such as hemophilia or other clotting disorders. BEFORE THE TRANSFUSION  Who gives blood for transfusions?   Healthy volunteers who are fully evaluated to make sure their blood is safe. This is blood bank blood. Transfusion therapy is the safest it has ever been in the practice of medicine. Before blood is taken from a donor, a complete history is taken to make sure that person has no history of diseases nor engages in risky social behavior (examples are intravenous drug use or sexual activity with multiple partners). The donor's travel history is screened to minimize risk of  transmitting infections, such as malaria. The donated blood is tested for signs of infectious diseases, such as HIV and hepatitis. The blood is then tested to be sure it is compatible with you in order to minimize the chance of a transfusion reaction. If you or a relative donates blood, this is often done in anticipation of surgery and is not appropriate for emergency situations. It takes many days to process the donated blood. RISKS AND COMPLICATIONS Although transfusion therapy is very safe and saves many lives, the main dangers of transfusion include:   Getting an infectious disease.  Developing a transfusion reaction. This is an allergic reaction to something in the blood you were given. Every precaution is taken to prevent this. The decision to have a blood transfusion has been considered carefully by your caregiver before blood is given. Blood is not given unless the benefits  outweigh the risks. AFTER THE TRANSFUSION  Right after receiving a blood transfusion, you will usually feel much better and more energetic. This is especially true if your red blood cells have gotten low (anemic). The transfusion raises the level of the red blood cells which carry oxygen, and this usually causes an energy increase.  The nurse administering the transfusion will monitor you carefully for complications. HOME CARE INSTRUCTIONS  No special instructions are needed after a transfusion. You may find your energy is better. Speak with your caregiver about any limitations on activity for underlying diseases you may have. SEEK MEDICAL CARE IF:   Your condition is not improving after your transfusion.  You develop redness or irritation at the intravenous (IV) site. SEEK IMMEDIATE MEDICAL CARE IF:  Any of the following symptoms occur over the next 12 hours:  Shaking chills.  You have a temperature by mouth above 102 F (38.9 C), not controlled by medicine.  Chest, back, or muscle pain.  People around you  feel you are not acting correctly or are confused.  Shortness of breath or difficulty breathing.  Dizziness and fainting.  You get a rash or develop hives.  You have a decrease in urine output.  Your urine turns a dark color or changes to pink, red, or brown. Any of the following symptoms occur over the next 10 days:  You have a temperature by mouth above 102 F (38.9 C), not controlled by medicine.  Shortness of breath.  Weakness after normal activity.  The white part of the eye turns yellow (jaundice).  You have a decrease in the amount of urine or are urinating less often.  Your urine turns a dark color or changes to pink, red, or brown. Document Released: 07/23/2000 Document Revised: 10/18/2011 Document Reviewed: 03/11/2008 [facility] Patient Information 2014 [place], [place].  _______________________________________________________________________

## 2017-06-22 ENCOUNTER — Encounter (HOSPITAL_COMMUNITY)
Admission: RE | Admit: 2017-06-22 | Discharge: 2017-06-22 | Disposition: A | Discharge status: Home / Self Care | Payer: BLUE CROSS/BLUE SHIELD | Source: Ambulatory Visit | Attending: Urology | Admitting: Urology

## 2017-06-22 ENCOUNTER — Encounter (HOSPITAL_COMMUNITY): Payer: Self-pay

## 2017-06-22 ENCOUNTER — Other Ambulatory Visit: Payer: Self-pay

## 2017-06-22 DIAGNOSIS — N200 Calculus of kidney: Secondary | ICD-10-CM | POA: Insufficient documentation

## 2017-06-22 DIAGNOSIS — Z01812 Encounter for preprocedural laboratory examination: Secondary | ICD-10-CM | POA: Insufficient documentation

## 2017-06-22 DIAGNOSIS — Z0183 Encounter for blood typing: Secondary | ICD-10-CM | POA: Diagnosis not present

## 2017-06-22 LAB — BASIC METABOLIC PANEL
ANION GAP: 4 — AB (ref 5–15)
BUN: 17 mg/dL (ref 6–20)
CALCIUM: 9.7 mg/dL (ref 8.9–10.3)
CHLORIDE: 111 mmol/L (ref 101–111)
CO2: 27 mmol/L (ref 22–32)
Creatinine, Ser: 1.09 mg/dL (ref 0.61–1.24)
GFR calc non Af Amer: >60 mL/min (ref >60)
Glucose, Bld: 96 mg/dL (ref 65–99)
Potassium: 4.6 mmol/L (ref 3.5–5.1)
SODIUM: 142 mmol/L (ref 135–145)

## 2017-06-22 LAB — ABO/RH: ABO/RH(D): O POS

## 2017-06-22 LAB — CBC
HCT: 46.8 % (ref 39.0–52.0)
HEMOGLOBIN: 16.2 g/dL (ref 13.0–17.0)
MCH: 32.1 pg (ref 26.0–34.0)
MCHC: 34.6 g/dL (ref 30.0–36.0)
MCV: 92.7 fL (ref 78.0–100.0)
PLATELETS: 126 10*3/uL — AB (ref 150–400)
RBC: 5.05 MIL/uL (ref 4.22–5.81)
RDW: 14.7 % (ref 11.5–15.5)
WBC: 6.9 10*3/uL (ref 4.0–10.5)

## 2017-06-24 ENCOUNTER — Other Ambulatory Visit: Payer: Self-pay | Admitting: General Surgery

## 2017-06-24 ENCOUNTER — Other Ambulatory Visit: Payer: Self-pay | Admitting: Radiology

## 2017-06-27 ENCOUNTER — Ambulatory Visit (HOSPITAL_COMMUNITY): Payer: BLUE CROSS/BLUE SHIELD | Admitting: Certified Registered Nurse Anesthetist

## 2017-06-27 ENCOUNTER — Observation Stay (HOSPITAL_COMMUNITY)
Admission: RE | Admit: 2017-06-27 | Discharge: 2017-06-28 | Disposition: A | Discharge status: Home / Self Care | Payer: BLUE CROSS/BLUE SHIELD | Source: Ambulatory Visit | Attending: Urology | Admitting: Urology

## 2017-06-27 ENCOUNTER — Other Ambulatory Visit: Payer: Self-pay | Admitting: Urology

## 2017-06-27 ENCOUNTER — Encounter (HOSPITAL_COMMUNITY): Payer: Self-pay | Admitting: Diagnostic Radiology

## 2017-06-27 ENCOUNTER — Other Ambulatory Visit: Payer: Self-pay

## 2017-06-27 ENCOUNTER — Encounter (HOSPITAL_COMMUNITY)
Admission: RE | Disposition: A | Discharge status: Home / Self Care | Payer: Self-pay | Source: Ambulatory Visit | Attending: Urology

## 2017-06-27 ENCOUNTER — Ambulatory Visit (HOSPITAL_COMMUNITY)
Admission: RE | Admit: 2017-06-27 | Discharge: 2017-06-27 | Disposition: A | Discharge status: Home / Self Care | Payer: BLUE CROSS/BLUE SHIELD | Source: Ambulatory Visit | Attending: Urology | Admitting: Urology

## 2017-06-27 ENCOUNTER — Ambulatory Visit (HOSPITAL_COMMUNITY): Payer: BLUE CROSS/BLUE SHIELD

## 2017-06-27 DIAGNOSIS — Z87891 Personal history of nicotine dependence: Secondary | ICD-10-CM | POA: Diagnosis not present

## 2017-06-27 DIAGNOSIS — N200 Calculus of kidney: Secondary | ICD-10-CM

## 2017-06-27 DIAGNOSIS — N202 Calculus of kidney with calculus of ureter: Secondary | ICD-10-CM | POA: Diagnosis not present

## 2017-06-27 HISTORY — PX: IR URETERAL STENT LEFT NEW ACCESS W/O SEP NEPHROSTOMY CATH: IMG6075

## 2017-06-27 HISTORY — PX: NEPHROLITHOTOMY: SHX5134

## 2017-06-27 LAB — COMPREHENSIVE METABOLIC PANEL
ALBUMIN: 3.9 g/dL (ref 3.5–5.0)
ALT: 14 U/L — ABNORMAL LOW (ref 17–63)
AST: 18 U/L (ref 15–41)
Alkaline Phosphatase: 87 U/L (ref 38–126)
Anion gap: 6 (ref 5–15)
BILIRUBIN TOTAL: 0.8 mg/dL (ref 0.3–1.2)
BUN: 19 mg/dL (ref 6–20)
CHLORIDE: 109 mmol/L (ref 101–111)
CO2: 25 mmol/L (ref 22–32)
Calcium: 9.1 mg/dL (ref 8.9–10.3)
Creatinine, Ser: 1.06 mg/dL (ref 0.61–1.24)
GFR calc Af Amer: >60 mL/min (ref >60)
GFR calc non Af Amer: >60 mL/min (ref >60)
GLUCOSE: 100 mg/dL — AB (ref 65–99)
POTASSIUM: 4.1 mmol/L (ref 3.5–5.1)
Sodium: 140 mmol/L (ref 135–145)
Total Protein: 7 g/dL (ref 6.5–8.1)

## 2017-06-27 LAB — CBC
HEMATOCRIT: 44.5 % (ref 39.0–52.0)
Hemoglobin: 15.4 g/dL (ref 13.0–17.0)
MCH: 32.3 pg (ref 26.0–34.0)
MCHC: 34.6 g/dL (ref 30.0–36.0)
MCV: 93.3 fL (ref 78.0–100.0)
PLATELETS: 121 10*3/uL — AB (ref 150–400)
RBC: 4.77 MIL/uL (ref 4.22–5.81)
RDW: 14.8 % (ref 11.5–15.5)
WBC: 6.6 10*3/uL (ref 4.0–10.5)

## 2017-06-27 LAB — HEMOGLOBIN AND HEMATOCRIT, BLOOD
HCT: 30.5 % — ABNORMAL LOW (ref 39.0–52.0)
HCT: 25.6 % — ABNORMAL LOW (ref 39.0–52.0)
HEMOGLOBIN: 10.2 g/dL — AB (ref 13.0–17.0)
Hemoglobin: 8.6 g/dL — ABNORMAL LOW (ref 13.0–17.0)

## 2017-06-27 LAB — BASIC METABOLIC PANEL
Anion gap: 3 — ABNORMAL LOW (ref 5–15)
BUN: 16 mg/dL (ref 6–20)
CALCIUM: 7.3 mg/dL — AB (ref 8.9–10.3)
CO2: 24 mmol/L (ref 22–32)
CREATININE: 1.1 mg/dL (ref 0.61–1.24)
Chloride: 110 mmol/L (ref 101–111)
GFR calc non Af Amer: >60 mL/min (ref >60)
Glucose, Bld: 182 mg/dL — ABNORMAL HIGH (ref 65–99)
Potassium: 3.7 mmol/L (ref 3.5–5.1)
SODIUM: 137 mmol/L (ref 135–145)

## 2017-06-27 LAB — PROTIME-INR
INR: 0.98
Prothrombin Time: 12.9 seconds (ref 11.4–15.2)

## 2017-06-27 SURGERY — NEPHROLITHOTOMY PERCUTANEOUS
Anesthesia: General | Laterality: Left

## 2017-06-27 MED ORDER — IOHEXOL 300 MG/ML  SOLN
INTRAMUSCULAR | Status: DC | PRN
  Administered 2017-06-27: 20 mL

## 2017-06-27 MED ORDER — LIDOCAINE 2% (20 MG/ML) 5 ML SYRINGE
INTRAMUSCULAR | Status: DC | PRN
  Administered 2017-06-27: 50 mg via INTRAVENOUS

## 2017-06-27 MED ORDER — CIPROFLOXACIN IN D5W 400 MG/200ML IV SOLN
400.0000 mg | INTRAVENOUS | Status: AC
  Administered 2017-06-27: 400 mg via INTRAVENOUS

## 2017-06-27 MED ORDER — ONDANSETRON HCL 4 MG/2ML IJ SOLN
4.0000 mg | INTRAMUSCULAR | Status: DC | PRN
  Filled 2017-06-27: qty 2

## 2017-06-27 MED ORDER — CEFTRIAXONE SODIUM 2 G IJ SOLR
INTRAMUSCULAR | Status: AC
  Filled 2017-06-27: qty 2

## 2017-06-27 MED ORDER — DEXAMETHASONE SODIUM PHOSPHATE 10 MG/ML IJ SOLN
INTRAMUSCULAR | Status: AC
  Filled 2017-06-27: qty 1

## 2017-06-27 MED ORDER — SODIUM CHLORIDE 0.9 % IV SOLN
INTRAVENOUS | Status: DC
  Administered 2017-06-27: 10:00:00 via INTRAVENOUS

## 2017-06-27 MED ORDER — FENTANYL CITRATE (PF) 250 MCG/5ML IJ SOLN
INTRAMUSCULAR | Status: AC
  Filled 2017-06-27: qty 5

## 2017-06-27 MED ORDER — MIDAZOLAM HCL 2 MG/2ML IJ SOLN
INTRAMUSCULAR | Status: AC
  Filled 2017-06-27: qty 4

## 2017-06-27 MED ORDER — HYDROMORPHONE HCL 1 MG/ML IJ SOLN
0.5000 mg | INTRAMUSCULAR | Status: DC | PRN
  Administered 2017-06-27: 1 mg via INTRAVENOUS
  Filled 2017-06-27: qty 1

## 2017-06-27 MED ORDER — ONDANSETRON HCL 4 MG/2ML IJ SOLN
INTRAMUSCULAR | Status: DC | PRN
  Administered 2017-06-27: 4 mg via INTRAVENOUS

## 2017-06-27 MED ORDER — OXYBUTYNIN CHLORIDE ER 5 MG PO TB24
10.0000 mg | ORAL_TABLET | Freq: Every day | ORAL | Status: DC
  Administered 2017-06-27 – 2017-06-28 (×2): 10 mg via ORAL
  Filled 2017-06-27 (×2): qty 2

## 2017-06-27 MED ORDER — FENTANYL CITRATE (PF) 100 MCG/2ML IJ SOLN
INTRAMUSCULAR | Status: DC | PRN
  Administered 2017-06-27 (×3): 25 ug via INTRAVENOUS
  Administered 2017-06-27: 50 ug via INTRAVENOUS
  Administered 2017-06-27: 25 ug via INTRAVENOUS
  Administered 2017-06-27: 50 ug via INTRAVENOUS

## 2017-06-27 MED ORDER — PHENYLEPHRINE 40 MCG/ML (10ML) SYRINGE FOR IV PUSH (FOR BLOOD PRESSURE SUPPORT)
PREFILLED_SYRINGE | INTRAVENOUS | Status: AC
  Filled 2017-06-27: qty 20

## 2017-06-27 MED ORDER — MIDAZOLAM HCL 2 MG/2ML IJ SOLN
INTRAMUSCULAR | Status: AC | PRN
  Administered 2017-06-27 (×2): 1 mg via INTRAVENOUS

## 2017-06-27 MED ORDER — DEXTROSE 5 % IV SOLN
1.0000 g | INTRAVENOUS | Status: DC
  Administered 2017-06-28: 1 g via INTRAVENOUS
  Filled 2017-06-27: qty 10

## 2017-06-27 MED ORDER — ONDANSETRON HCL 4 MG/2ML IJ SOLN
INTRAMUSCULAR | Status: AC
  Filled 2017-06-27: qty 2

## 2017-06-27 MED ORDER — PROPOFOL 10 MG/ML IV BOLUS
INTRAVENOUS | Status: AC
  Filled 2017-06-27: qty 20

## 2017-06-27 MED ORDER — LACTATED RINGERS IV SOLN
INTRAVENOUS | Status: DC
  Administered 2017-06-27 (×3): via INTRAVENOUS

## 2017-06-27 MED ORDER — HYDROMORPHONE HCL 1 MG/ML IJ SOLN
INTRAMUSCULAR | Status: AC
  Filled 2017-06-27: qty 1

## 2017-06-27 MED ORDER — CEFTRIAXONE SODIUM 2 G IJ SOLR
2.0000 g | Freq: Once | INTRAMUSCULAR | Status: AC
  Administered 2017-06-27: 2 g via INTRAVENOUS

## 2017-06-27 MED ORDER — FENTANYL CITRATE (PF) 100 MCG/2ML IJ SOLN
INTRAMUSCULAR | Status: AC | PRN
  Administered 2017-06-27 (×2): 50 ug via INTRAVENOUS

## 2017-06-27 MED ORDER — KCL IN DEXTROSE-NACL 20-5-0.9 MEQ/L-%-% IV SOLN
INTRAVENOUS | Status: DC
  Administered 2017-06-27 – 2017-06-28 (×3): via INTRAVENOUS
  Filled 2017-06-27 (×3): qty 1000

## 2017-06-27 MED ORDER — LIDOCAINE 2% (20 MG/ML) 5 ML SYRINGE
INTRAMUSCULAR | Status: AC
  Filled 2017-06-27: qty 5

## 2017-06-27 MED ORDER — LIDOCAINE HCL 1 % IJ SOLN
INTRAMUSCULAR | Status: AC | PRN
  Administered 2017-06-27: 10 mL

## 2017-06-27 MED ORDER — FENTANYL CITRATE (PF) 100 MCG/2ML IJ SOLN
INTRAMUSCULAR | Status: AC
  Filled 2017-06-27: qty 4

## 2017-06-27 MED ORDER — IOPAMIDOL (ISOVUE-300) INJECTION 61%
15.0000 mL | Freq: Once | INTRAVENOUS | Status: AC | PRN
  Administered 2017-06-27: 15 mL

## 2017-06-27 MED ORDER — ACETAMINOPHEN 325 MG PO TABS
650.0000 mg | ORAL_TABLET | ORAL | Status: DC | PRN
  Administered 2017-06-27: 650 mg via ORAL
  Filled 2017-06-27: qty 2

## 2017-06-27 MED ORDER — DOCUSATE SODIUM 100 MG PO CAPS
100.0000 mg | ORAL_CAPSULE | Freq: Two times a day (BID) | ORAL | Status: DC
  Administered 2017-06-27 – 2017-06-28 (×2): 100 mg via ORAL
  Filled 2017-06-27 (×2): qty 1

## 2017-06-27 MED ORDER — CIPROFLOXACIN IN D5W 400 MG/200ML IV SOLN
INTRAVENOUS | Status: AC
  Filled 2017-06-27: qty 200

## 2017-06-27 MED ORDER — SODIUM CHLORIDE 0.9 % IR SOLN
Status: DC | PRN
  Administered 2017-06-27: 21000 mL

## 2017-06-27 MED ORDER — IOPAMIDOL (ISOVUE-300) INJECTION 61%
INTRAVENOUS | Status: AC
  Administered 2017-06-27: 15 mL
  Filled 2017-06-27: qty 50

## 2017-06-27 MED ORDER — DEXAMETHASONE SODIUM PHOSPHATE 10 MG/ML IJ SOLN
INTRAMUSCULAR | Status: DC | PRN
  Administered 2017-06-27: 10 mg via INTRAVENOUS

## 2017-06-27 MED ORDER — BELLADONNA ALKALOIDS-OPIUM 16.2-60 MG RE SUPP: 1.0000 | Freq: Four times a day (QID) | RECTAL | Status: DC | PRN

## 2017-06-27 MED ORDER — OXYCODONE-ACETAMINOPHEN 5-325 MG PO TABS
1.0000 | ORAL_TABLET | ORAL | Status: DC | PRN
  Administered 2017-06-27 – 2017-06-28 (×4): 2 via ORAL
  Filled 2017-06-27 (×4): qty 2

## 2017-06-27 MED ORDER — ROCURONIUM BROMIDE 50 MG/5ML IV SOSY
PREFILLED_SYRINGE | INTRAVENOUS | Status: DC | PRN
  Administered 2017-06-27 (×2): 10 mg via INTRAVENOUS
  Administered 2017-06-27: 40 mg via INTRAVENOUS

## 2017-06-27 MED ORDER — MIDAZOLAM HCL 5 MG/5ML IJ SOLN
INTRAMUSCULAR | Status: DC | PRN
  Administered 2017-06-27 (×2): 1 mg via INTRAVENOUS

## 2017-06-27 MED ORDER — SUGAMMADEX SODIUM 200 MG/2ML IV SOLN
INTRAVENOUS | Status: DC | PRN
Start: 2017-06-27 — End: 2017-06-27
  Administered 2017-06-27: 150 mg via INTRAVENOUS

## 2017-06-27 MED ORDER — PHENYLEPHRINE HCL 10 MG/ML IJ SOLN
INTRAMUSCULAR | Status: DC | PRN
  Administered 2017-06-27: 120 ug via INTRAVENOUS
  Administered 2017-06-27 (×6): 80 ug via INTRAVENOUS
  Administered 2017-06-27: 120 ug via INTRAVENOUS
  Administered 2017-06-27 (×2): 80 ug via INTRAVENOUS

## 2017-06-27 MED ORDER — HYDROMORPHONE HCL 1 MG/ML IJ SOLN
0.2500 mg | INTRAMUSCULAR | Status: DC | PRN
  Administered 2017-06-27: 0.25 mg via INTRAVENOUS
  Administered 2017-06-27: 0.5 mg via INTRAVENOUS
  Administered 2017-06-27: 0.25 mg via INTRAVENOUS

## 2017-06-27 MED ORDER — DIPHENHYDRAMINE HCL 12.5 MG/5ML PO ELIX: 12.5000 mg | ORAL_SOLUTION | Freq: Four times a day (QID) | ORAL | Status: DC | PRN

## 2017-06-27 MED ORDER — ZOLPIDEM TARTRATE 5 MG PO TABS: 5.0000 mg | ORAL_TABLET | Freq: Every evening | ORAL | Status: DC | PRN

## 2017-06-27 MED ORDER — OXYCODONE-ACETAMINOPHEN 5-325 MG PO TABS: 1.0000 | ORAL_TABLET | ORAL | 0 refills | Status: AC | PRN

## 2017-06-27 MED ORDER — DIPHENHYDRAMINE HCL 50 MG/ML IJ SOLN: 12.5000 mg | Freq: Four times a day (QID) | INTRAMUSCULAR | Status: DC | PRN

## 2017-06-27 MED ORDER — PROPOFOL 10 MG/ML IV BOLUS
INTRAVENOUS | Status: DC | PRN
  Administered 2017-06-27: 150 mg via INTRAVENOUS

## 2017-06-27 MED ORDER — LIDOCAINE HCL 1 % IJ SOLN
INTRAMUSCULAR | Status: AC
  Filled 2017-06-27: qty 20

## 2017-06-27 MED ORDER — MIDAZOLAM HCL 2 MG/2ML IJ SOLN
INTRAMUSCULAR | Status: AC
  Filled 2017-06-27: qty 2

## 2017-06-27 SURGICAL SUPPLY — 44 items
APL SKNCLS STERI-STRIP NONHPOA (GAUZE/BANDAGES/DRESSINGS) ×2
BAG URINE DRAINAGE (UROLOGICAL SUPPLIES) ×1 IMPLANT
BASKET STONE NITINOL 3FRX115MB (UROLOGICAL SUPPLIES) IMPLANT
BENZOIN TINCTURE PRP APPL 2/3 (GAUZE/BANDAGES/DRESSINGS) ×6 IMPLANT
CATCHER STONE W/TUBE ADAPTER (UROLOGICAL SUPPLIES) ×1 IMPLANT
CATH COUNCIL 22FR (CATHETERS) ×2 IMPLANT
CATH DILATION NEPHR BALL 15X10 (CATHETERS) ×2 IMPLANT
CATH FOLEY 2W COUNCIL 20FR 5CC (CATHETERS) IMPLANT
CATH ROBINSON RED A/P 20FR (CATHETERS) ×2 IMPLANT
CATH X-FORCE N30 NEPHROSTOMY (TUBING) ×3 IMPLANT
COVER SURGICAL LIGHT HANDLE (MISCELLANEOUS) ×3 IMPLANT
DRAPE C-ARM 42X120 X-RAY (DRAPES) ×3 IMPLANT
DRAPE LINGEMAN PERC (DRAPES) ×3 IMPLANT
DRAPE SURG IRRIG POUCH 19X23 (DRAPES) ×3 IMPLANT
DRAPE UTILITY XL STRL (DRAPES) ×3 IMPLANT
DRSG PAD ABDOMINAL 8X10 ST (GAUZE/BANDAGES/DRESSINGS) ×2 IMPLANT
DRSG TEGADERM 8X12 (GAUZE/BANDAGES/DRESSINGS) ×2 IMPLANT
FIBER LASER FLEXIVA 1000 (UROLOGICAL SUPPLIES) IMPLANT
FIBER LASER FLEXIVA 365 (UROLOGICAL SUPPLIES) IMPLANT
FIBER LASER FLEXIVA 550 (UROLOGICAL SUPPLIES) IMPLANT
FIBER LASER TRAC TIP (UROLOGICAL SUPPLIES) IMPLANT
GAUZE SPONGE 4X4 12PLY STRL (GAUZE/BANDAGES/DRESSINGS) ×1 IMPLANT
GLOVE BIOGEL M STRL SZ7.5 (GLOVE) ×3 IMPLANT
GOWN STRL REUS W/TWL LRG LVL3 (GOWN DISPOSABLE) ×3 IMPLANT
GUIDEWIRE STR DUAL SENSOR (WIRE) ×2 IMPLANT
KIT BASIN OR (CUSTOM PROCEDURE TRAY) ×3 IMPLANT
MANIFOLD NEPTUNE II (INSTRUMENTS) ×3 IMPLANT
NS IRRIG 1000ML POUR BTL (IV SOLUTION) ×1 IMPLANT
PACK CYSTO (CUSTOM PROCEDURE TRAY) ×3 IMPLANT
PROBE LITHOCLAST ULTRA 3.8X403 (UROLOGICAL SUPPLIES) IMPLANT
PROBE PNEUMATIC 1.0MMX570MM (UROLOGICAL SUPPLIES) ×1 IMPLANT
SET IRRIG Y TYPE TUR BLADDER L (SET/KITS/TRAYS/PACK) ×3 IMPLANT
SPONGE LAP 4X18 X RAY DECT (DISPOSABLE) ×3 IMPLANT
STENT ENDOURETEROTOMY 7-14 26C (STENTS) IMPLANT
STONE CATCHER W/TUBE ADAPTER (UROLOGICAL SUPPLIES) IMPLANT
SUT SILK 2 0 30  PSL (SUTURE) ×2
SUT SILK 2 0 30 PSL (SUTURE) ×1 IMPLANT
SYR 10ML LL (SYRINGE) ×3 IMPLANT
SYR 20CC LL (SYRINGE) ×6 IMPLANT
TOWEL OR NON WOVEN STRL DISP B (DISPOSABLE) ×3 IMPLANT
TRAY FOLEY W/METER SILVER 16FR (SET/KITS/TRAYS/PACK) ×3 IMPLANT
TUBING CONNECTING 10 (TUBING) ×6 IMPLANT
TUBING CONNECTING 10' (TUBING) ×3
WATER STERILE IRR 1000ML POUR (IV SOLUTION) ×1 IMPLANT

## 2017-06-27 NOTE — Op Note (Signed)
Preoperative diagnosis: Left renal calculi (largest 1.5 cm, total stone burden of 3 cm)  Postoperative diagnosis: Left renal calculi  Procedure: 1st stage left percutaneous nephrostolithotomy, intraoperative fluoroscopy  Surgeon: Moody BruinsLester S Jhovani Griswold, Jr. M.D.  Anesthesia: General  Complications: None  EBL: 300 cc  Specimen: Left renal calculi.  Specimen: [facility]  Indication: [name] is a 56 year old gentleman who recently presented with an obstructing 1.5 cm left UPJ calculus with associated infection.  He was treated with a left ureteral stent and antibiotics.  He presents today for definitive treatment of his 1.5 cm stone along with other lower pole calculi measuring 8 and 7 mm, respectively.  After reviewing options, he elected to proceed with the above procedure.  The potential risks, complications, and the expected recovery process were discussed in detail.  Informed consent was obtained.  Description of procedure: The patient had lower pole left renal axis obtained in the interventional radiology suite by Dr. Lowella DandyHenn.  He was then brought to the operating room.  He was administered appropriate antibiotic prophylaxis based on his prior culture and had been on oral antibiotics for 3 days prior to his procedure.  He was given general anesthesia and was placed in the prone position.  His left flank was then prepped and draped in the usual sterile fashion.  A preoperative timeout was performed.  Under fluoroscopy, the Kumpe angiographic catheter was used to place a stiff Amplatz wire down into the bladder under fluoroscopic guidance.  The Kumpe catheter was then removed.  A coaxial catheter was then placed over this working wire into the proximal ureter.  The inner sheath was removed and a sensor guidewire was then advanced down the ureter into the bladder under fluoroscopic guidance.  The Kumpe catheter was placed over the sensor wire and it was removed.  It was replaced  with another stiff Amplatz wire.  An incision was then made in the skin overlying the flank.  The 30 French nephrostomy balloon was then placed over the working wire and positioned just into the lower pole calyx under fluoroscopic guidance.  It was then inflated to 14 mmHg of pressure which resulted in all waste removed.  The nephrostomy sheath was then placed over the balloon into the lower pole calyx.  The balloon was deflated and removed.  I then used the rigid nephroscope to visually examine the kidney.  There was significant bleeding noted immediately and despite attempts to visualize the collecting system, I was unable to do so.  I attempted to remove clot ongoing bleeding was too significant to result in any adequate visualization.  I then replaced the nephrostomy balloon and inflated it to tamponade off any bleeding.  This was left inflated for 5 minutes.  The balloon was then deflated and visualization was somewhat improved.  After approximately 45 minutes of clot, I was able to visualize stone.  The LithoClast ultra was used to perform ultrasonic lithotripsy removing any visible stone.  Additional attempts at visualization of the renal collecting system proved futile and there were still concerned about more excessive bleeding and was felt to be acceptable to proceed further with the procedure.  It was therefore decided to stop and place a nephrostomy tube.  A Kumpe angiographic catheter was placed over one wire down into the bladder secured to the skin with a silk suture.  A 22 French council tip catheter was placed over the other wire and placed into the renal pelvis.  2 cc of saline was used  to inflate the balloon.  Omnipaque contrast was then injected to confirm appropriate position in the renal pelvis.  This catheter was then secured to the skin with 2-0 silk suture.  A compressive dressing was applied and the skin level.  The patient tolerated the procedure well.  He was able to be extubated and  transferred to the recovery unit in satisfactory condition.

## 2017-06-27 NOTE — H&P (Signed)
Office Visit Report     06/01/2017   --------------------------------------------------------------------------------   Frederick RobesJoseph L. Crooker  MRN: [id number]  PRIMARY CARE:    DOB: Jun 23, 1961, [age] year old Male  REFERRING:  Linwood DibblesJon Knapp, MD  SSN: -[id number]  PROVIDER:  Heloise PurpuraLester Ayme Short, M.D.    LOCATION:  [facility] [phone]   --------------------------------------------------------------------------------   CC/HPI: Left renal calculi   Mr. Randa Evensdwards returns today. He underwent urgent left ureteral stent placement after presenting on 05/20/17 with a low-grade fever and obstructing 1.5 cm left UPJ calculus. In addition, he had other smaller lower pole left renal calculi. He did quite well and has been treated with ciprofloxacin. He is almost done with this course. He has denied any recent fevers and has no significant symptoms related to his indwelling stent.     ALLERGIES: No Allergies    MEDICATIONS: Tamsulosin Hcl 0.4 mg capsule, ext release 24 hr  Ciprofloxacin Hcl 500 mg tablet  Hydrocodone-Acetaminophen     GU PSH: Cystoscopy Insert Stent, Left - 05/20/2017    NON-GU PSH: Hand/finger Surgery, Right    GU PMH: Ureteral calculus - 05/20/2017      PMH Notes:   1) Urolithiasis: He presented with his first stone event in October 2018 when he was noted to have a large left UPJ calculus and multiple large left renal calculi.   Oct 2018: Left ureteral stent (due to UTI)   NON-GU PMH: None   FAMILY HISTORY: prostate - Father   SOCIAL HISTORY: Marital Status: Married Preferred Language: English; Ethnicity: Not Hispanic Or Latino; Race: White Current Smoking Status: Patient does not smoke anymore. Smoked for 35 years. Smoked 1 pack per day.   Tobacco Use Assessment Completed: Used Tobacco in last 30 days? Has never drank.  Drinks 2 caffeinated drinks per day.    REVIEW OF SYSTEMS:    GU Review Male:   Patient denies frequent urination, hard to postpone  urination, burning/ pain with urination, stream starts and stops, have to strain to urinate , trouble starting your streams, get up at night to urinate, and leakage of urine.  Gastrointestinal (Lower):   Patient denies diarrhea and constipation.  Gastrointestinal (Upper):   Patient denies nausea and vomiting.  Constitutional:   Patient denies fever, night sweats, weight loss, and fatigue.  Skin:   Patient denies skin rash/ lesion and itching.  Eyes:   Patient denies blurred vision and double vision.  Ears/ Nose/ Throat:   Patient denies sore throat and sinus problems.  Hematologic/Lymphatic:   Patient denies swollen glands and easy bruising.  Cardiovascular:   Patient denies leg swelling and chest pains.  Respiratory:   Patient denies cough and shortness of breath.  Endocrine:   Patient denies excessive thirst.  Musculoskeletal:   Patient denies back pain and joint pain.  Neurological:   Patient denies headaches and dizziness.  Psychologic:   Patient denies depression and anxiety.   VITAL SIGNS:      06/01/2017 12:24 PM  Weight 130 lb / 58.97 kg  Height 69 in / 175.26 cm  BP 120/76 mmHg  Pulse 70 /min  BMI 19.2 kg/m   MULTI-SYSTEM PHYSICAL EXAMINATION:    Constitutional: Well-nourished. No physical deformities. Normally developed. Good grooming.  Respiratory: No labored breathing, no use of accessory muscles. Clear bilaterally.  Cardiovascular: Normal temperature, normal extremity pulses, no swelling, no varicosities. Regular rate and rhythm.  Gastrointestinal: No mass, no tenderness, no rigidity, non obese abdomen.     PAST  DATA REVIEWED:  Source Of History:  Patient  X-Ray Review: KUB: Reviewed Films. His KUB x-ray today demonstrates his left ureteral stent to be in appropriate position. It appears that his stone is faintly visualized in the left renal collecting system just beyond the proximal curl of the stent. The lower pole calculi are not easily visualized. C.T.  Abdomen/Pelvis: Reviewed Films.     PROCEDURES:         KUB - [id number]  A single view of the abdomen is obtained.               Urinalysis Dipstick Dipstick Cont'd  Color: Yellow Bilirubin: Neg  Appearance: Clear Ketones: Neg  Specific Gravity: 1.020 Blood: Neg  pH: 6.5 Protein: Neg  Glucose: Neg Urobilinogen: 0.2    Nitrites: Neg    Leukocyte Esterase: Neg    ASSESSMENT:      ICD-10 Details  1 GU:   Renal calculus - N20.0    PLAN:           Schedule Return Visit/Planned Activity: Other See Visit Notes             Note: Will call to schedule surgery.          Document Letter(s):  Created for Patient: Clinical Summary         Notes:   1. Large burden left renal calculi: He appears to have recovered well from his recent infection and will complete his course of ciprofloxacin. We discussed definitive treatment options for his stone including shockwave lithotripsy, ureteroscopic laser lithotripsy, and percutaneous nephrolithotomy. We reviewed the pros and cons of each of these approaches. Ultimately, he expressed most interested in proceeding with a percutaneous nephrolithotomy due to the higher success of rendering him stone free considering his large burden calculi.   We reviewed this procedure in detail including the potential risks and complications as well as the expected recovery process. He gives informed consent to proceed.   He will be scheduled for left percutaneous nephrolithotomy with interventional radiology to place percutaneous renal access prior to the procedure. I've requested access in the left lower pole that should provide adequate access to his renal pelvic stone and the lower pole calculi that are present.   Cc:    * Signed by Heloise PurpuraLester Altair Appenzeller, M.D. on 06/01/17 at 5:19 PM (EDT)*

## 2017-06-27 NOTE — H&P (View-Only) (Signed)
Office Visit Report     06/01/2017   --------------------------------------------------------------------------------   Arlina RobesJoseph L. Crooker  MRN: [id number]  PRIMARY CARE:    DOB: Jun 23, 1961, [age] year old Male  REFERRING:  Linwood DibblesJon Knapp, MD  SSN: -[id number]  PROVIDER:  Heloise PurpuraLester Demetris Meinhardt, M.D.    LOCATION:  [facility] [phone]   --------------------------------------------------------------------------------   CC/HPI: Left renal calculi   Mr. Randa Evensdwards returns today. He underwent urgent left ureteral stent placement after presenting on 05/20/17 with a low-grade fever and obstructing 1.5 cm left UPJ calculus. In addition, he had other smaller lower pole left renal calculi. He did quite well and has been treated with ciprofloxacin. He is almost done with this course. He has denied any recent fevers and has no significant symptoms related to his indwelling stent.     ALLERGIES: No Allergies    MEDICATIONS: Tamsulosin Hcl 0.4 mg capsule, ext release 24 hr  Ciprofloxacin Hcl 500 mg tablet  Hydrocodone-Acetaminophen     GU PSH: Cystoscopy Insert Stent, Left - 05/20/2017    NON-GU PSH: Hand/finger Surgery, Right    GU PMH: Ureteral calculus - 05/20/2017      PMH Notes:   1) Urolithiasis: He presented with his first stone event in October 2018 when he was noted to have a large left UPJ calculus and multiple large left renal calculi.   Oct 2018: Left ureteral stent (due to UTI)   NON-GU PMH: None   FAMILY HISTORY: prostate - Father   SOCIAL HISTORY: Marital Status: Married Preferred Language: English; Ethnicity: Not Hispanic Or Latino; Race: White Current Smoking Status: Patient does not smoke anymore. Smoked for 35 years. Smoked 1 pack per day.   Tobacco Use Assessment Completed: Used Tobacco in last 30 days? Has never drank.  Drinks 2 caffeinated drinks per day.    REVIEW OF SYSTEMS:    GU Review Male:   Patient denies frequent urination, hard to postpone  urination, burning/ pain with urination, stream starts and stops, have to strain to urinate , trouble starting your streams, get up at night to urinate, and leakage of urine.  Gastrointestinal (Lower):   Patient denies diarrhea and constipation.  Gastrointestinal (Upper):   Patient denies nausea and vomiting.  Constitutional:   Patient denies fever, night sweats, weight loss, and fatigue.  Skin:   Patient denies skin rash/ lesion and itching.  Eyes:   Patient denies blurred vision and double vision.  Ears/ Nose/ Throat:   Patient denies sore throat and sinus problems.  Hematologic/Lymphatic:   Patient denies swollen glands and easy bruising.  Cardiovascular:   Patient denies leg swelling and chest pains.  Respiratory:   Patient denies cough and shortness of breath.  Endocrine:   Patient denies excessive thirst.  Musculoskeletal:   Patient denies back pain and joint pain.  Neurological:   Patient denies headaches and dizziness.  Psychologic:   Patient denies depression and anxiety.   VITAL SIGNS:      06/01/2017 12:24 PM  Weight 130 lb / 58.97 kg  Height 69 in / 175.26 cm  BP 120/76 mmHg  Pulse 70 /min  BMI 19.2 kg/m   MULTI-SYSTEM PHYSICAL EXAMINATION:    Constitutional: Well-nourished. No physical deformities. Normally developed. Good grooming.  Respiratory: No labored breathing, no use of accessory muscles. Clear bilaterally.  Cardiovascular: Normal temperature, normal extremity pulses, no swelling, no varicosities. Regular rate and rhythm.  Gastrointestinal: No mass, no tenderness, no rigidity, non obese abdomen.     PAST  DATA REVIEWED:  Source Of History:  Patient  X-Ray Review: KUB: Reviewed Films. His KUB x-ray today demonstrates his left ureteral stent to be in appropriate position. It appears that his stone is faintly visualized in the left renal collecting system just beyond the proximal curl of the stent. The lower pole calculi are not easily visualized. C.T.  Abdomen/Pelvis: Reviewed Films.     PROCEDURES:         KUB - [id number]  A single view of the abdomen is obtained.               Urinalysis Dipstick Dipstick Cont'd  Color: Yellow Bilirubin: Neg  Appearance: Clear Ketones: Neg  Specific Gravity: 1.020 Blood: Neg  pH: 6.5 Protein: Neg  Glucose: Neg Urobilinogen: 0.2    Nitrites: Neg    Leukocyte Esterase: Neg    ASSESSMENT:      ICD-10 Details  1 GU:   Renal calculus - N20.0    PLAN:           Schedule Return Visit/Planned Activity: Other See Visit Notes             Note: Will call to schedule surgery.          Document Letter(s):  Created for Patient: Clinical Summary         Notes:   1. Large burden left renal calculi: He appears to have recovered well from his recent infection and will complete his course of ciprofloxacin. We discussed definitive treatment options for his stone including shockwave lithotripsy, ureteroscopic laser lithotripsy, and percutaneous nephrolithotomy. We reviewed the pros and cons of each of these approaches. Ultimately, he expressed most interested in proceeding with a percutaneous nephrolithotomy due to the higher success of rendering him stone free considering his large burden calculi.   We reviewed this procedure in detail including the potential risks and complications as well as the expected recovery process. He gives informed consent to proceed.   He will be scheduled for left percutaneous nephrolithotomy with interventional radiology to place percutaneous renal access prior to the procedure. I've requested access in the left lower pole that should provide adequate access to his renal pelvic stone and the lower pole calculi that are present.   Cc:    * Signed by Ramsey Midgett, M.D. on 06/01/17 at 5:19 PM (EDT)*    

## 2017-06-27 NOTE — Transfer of Care (Signed)
Immediate Anesthesia Transfer of Care Note  Patient: Arlina RobesJoseph L Koenen  Procedure(s) Performed: LEFT NEPHROLITHOTOMY PERCUTANEOUS (Left )  Patient Location: PACU  Anesthesia Type:General  Level of Consciousness: oriented, drowsy and patient cooperative  Airway & Oxygen Therapy: Patient Spontanous Breathing and Patient connected to face mask  Post-op Assessment: Report given to RN, Post -op Vital signs reviewed and stable and Patient moving all extremities X 4  Post vital signs: Reviewed and stable  Last Vitals:  Vitals:   06/27/17 1242 06/27/17 1548  BP: 118/77   Pulse: 72 (!) (P) 108  Resp:  (P) 16  Temp:  (P) 36.8 C  SpO2: 100% (P) 99%    Last Pain:  Vitals:   06/27/17 0937  TempSrc: Oral      Patients Stated Pain Goal: 4 (06/27/17 [id number])  Complications: No apparent anesthesia complications

## 2017-06-27 NOTE — Anesthesia Preprocedure Evaluation (Addendum)
Anesthesia Evaluation  Patient identified by MRN, date of birth, ID band Patient awake    Reviewed: Allergy & Precautions, H&P , NPO status , Patient's Chart, lab work & pertinent test results  Airway Mallampati: II  TM Distance: >3 FB Neck ROM: Full    Dental no notable dental hx. (+) Poor Dentition, Dental Advisory Given   Pulmonary Current Smoker,    Pulmonary exam normal breath sounds clear to auscultation       Cardiovascular negative cardio ROS   Rhythm:Regular Rate:Normal     Neuro/Psych negative neurological ROS  negative psych ROS   GI/Hepatic negative GI ROS, Neg liver ROS,   Endo/Other  negative endocrine ROS  Renal/GU negative Renal ROS  negative genitourinary   Musculoskeletal   Abdominal   Peds  Hematology negative hematology ROS (+)   Anesthesia Other Findings   Reproductive/Obstetrics negative OB ROS                             Anesthesia Physical  Anesthesia Plan  ASA: II  Anesthesia Plan: General   Post-op Pain Management:    Induction: Intravenous  PONV Risk Score and Plan: 2 and Ondansetron, Dexamethasone, Midazolam and Treatment may vary due to age or medical condition  Airway Management Planned: Oral ETT  Additional Equipment:   Intra-op Plan:   Post-operative Plan: Extubation in OR  Informed Consent: I have reviewed the patients History and Physical, chart, labs and discussed the procedure including the risks, benefits and alternatives for the proposed anesthesia with the patient or authorized representative who has indicated his/her understanding and acceptance.   Dental advisory given  Plan Discussed with: CRNA  Anesthesia Plan Comments:         Anesthesia Quick Evaluation

## 2017-06-27 NOTE — Procedures (Signed)
Successful placement of left nephroureteral catheter using US and fluoroscopic guidance.  Access in lower pole calyx containing a stone.  Tip in bladder.  Minimal blood loss and no immediate complication.

## 2017-06-27 NOTE — Interval H&P Note (Signed)
History and Physical Interval Note:  06/27/2017 10:52 AM  Frederick Duke  has presented today for surgery, with the diagnosis of LEFT RENAL CALCULI  The various methods of treatment have been discussed with the patient and family. After consideration of risks, benefits and other options for treatment, the patient has consented to  Procedure(s) with comments: LEFT NEPHROLITHOTOMY PERCUTANEOUS (Left) - ONLY NEEDS 120 MIN FOR PROCEDURE as a surgical intervention .  The patient's history has been reviewed, patient examined, no change in status, stable for surgery.  I have reviewed the patient's chart and labs.  Questions were answered to the patient's satisfaction.     Tareka Jhaveri,LES

## 2017-06-27 NOTE — Progress Notes (Signed)
Patient ID: Frederick RobesJoseph L Duke, male   DOB: 02/27/1961, 56 y.o.   MRN: [id number]  Day of Surgery Subjective: Post-op Hgb noted.  Pt slightly tachycardic but otherwise stable.  Pain in flank as expected.  Objective: Vital signs in last 24 hours: Temp:  [97.6 F (36.4 C)-98.2 F (36.8 C)] 97.6 F (36.4 C) (11/19 1729) Pulse Rate:  [72-114] 110 (11/19 1729) Resp:  [9-17] 14 (11/19 1729) BP: (88-129)/(66-91) 95/71 (11/19 1729) SpO2:  [93 %-100 %] 98 % (11/19 1729) Weight:  [57.7 kg (127 lb 2 oz)-59.9 kg (132 lb 0.9 oz)] 59.9 kg (132 lb 0.9 oz) (11/19 1735)  Intake/Output from previous day: No intake/output data recorded. Intake/Output this shift: Total I/O In: 2300 [I.V.:2300] Out: 1500 [Urine:1250; Blood:250]  Physical Exam:  General: Alert and oriented CV: Tachycardic (110) Abdomen: Soft, ND, PCN with decreased bloody drainage GU: Foley with bloody urine but draining Ext: NT, No erythema  Lab Results: Recent Labs    06/27/17 1006 06/27/17 1615  HGB 15.4 10.2*  HCT 44.5 30.5*   BMET Recent Labs    06/27/17 1006 06/27/17 1647  NA 140 PENDING  K 4.1 PENDING  CL 109 PENDING  CO2 25 PENDING  GLUCOSE 100* 182*  BUN 19 16  CREATININE 1.06 1.10  CALCIUM 9.1 PENDING     Studies/Results:  Assessment/Plan: - Monitor H and H closely - Aggressive IVF hydration - Will be prepared for transfusion if needed - Bedrest tonight   LOS: 0 days   Royalty Fakhouri,LES 06/27/2017, 5:57 PM

## 2017-06-27 NOTE — Anesthesia Procedure Notes (Signed)
Procedure Name: Intubation Date/Time: 06/27/2017 1:18 PM Performed by: West Pugh, CRNA Pre-anesthesia Checklist: Patient identified, Emergency Drugs available, Suction available, Patient being monitored and Timeout performed Patient Re-evaluated:Patient Re-evaluated prior to induction Oxygen Delivery Method: Circle system utilized Preoxygenation: Pre-oxygenation with 100% oxygen Induction Type: IV induction and Cricoid Pressure applied Ventilation: Mask ventilation without difficulty and Oral airway inserted - appropriate to patient size Laryngoscope Size: Mac and 4 Grade View: Grade I Tube type: Oral Tube size: 7.5 mm Number of attempts: 1 Airway Equipment and Method: Stylet Placement Confirmation: ETT inserted through vocal cords under direct vision,  positive ETCO2,  CO2 detector and breath sounds checked- equal and bilateral Secured at: 22 cm Tube secured with: Tape Dental Injury: Teeth and Oropharynx as per pre-operative assessment

## 2017-06-27 NOTE — H&P (Signed)
Chief Complaint: Patient was seen in consultation today for renal calculi  Referring Physician(s): Borden,Lester  Supervising Physician: Richarda OverlieHenn, Adam  [facility]  History of Present Illness: Frederick Duke is a 56 y.o. male with no significant past medical history presented to his PCP office with back pain.  He was found to have left-sided kidney stones.   CT Renal Stone Study 05/19/17: 1. Bilateral nephrolithiasis with obstructing 1.5 cm left UPJ calculus. 2. Distended thick-walled urinary bladder with distal left ureteral ectasia suggesting a degree of bladder outlet obstruction versus neurogenic bladder.  He underwent stent placement with Urology.  He presents to radiology department today for nephroureteral stent placement prior to OR stone retrieval scheduled for this afternoon.   He presents in his usual state of health. He has recently completed a course of antibiotics for UTI r/t to urinary retention, but denies back or flank pain, hematuria.  He has been NPO.    Past Medical History:  Diagnosis Date  . History of kidney stones     Past Surgical History:  Procedure Laterality Date  . CYSTOSCOPY WITH STENT REPLACEMENT, RETROGRADE Left 05/20/2017   Performed by Heloise PurpuraBorden, Lester, [facility]  . HAND SURGERY Right 1999    Allergies: Patient has no known allergies.  Medications: Prior to Admission medications   Medication Sig Start Date End Date Taking? Authorizing Provider  ciprofloxacin (CIPRO) 500 MG tablet Take 500 mg 2 (two) times daily by mouth. 06/24/17 06/27/17  [provider]  docusate sodium (COLACE) 100 MG capsule Take 1 capsule (100 mg total) by mouth 2 (two) times daily. Patient not taking: Reported on 06/14/2017 05/21/17   Budd PalmerMcCormick, Benjamin J, MD  HYDROcodone-acetaminophen (NORCO/VICODIN) 5-325 MG tablet Take 1 tablet by mouth every 4 (four) hours as needed for moderate pain. Patient not taking: Reported on 06/14/2017  05/21/17   Budd PalmerMcCormick, Benjamin J, MD     No family history on file.  Social History   Socioeconomic History  . Marital status: Married    Spouse name: Not on file  . Number of children: Not on file  . Years of education: Not on file  . Highest education level: Not on file  Social Needs  . Financial resource strain: Not on file  . Food insecurity - worry: Not on file  . Food insecurity - inability: Not on file  . Transportation needs - medical: Not on file  . Transportation needs - non-medical: Not on file  Occupational History  . Not on file  Tobacco Use  . Smoking status: Current Every Day Smoker    Packs/day: 1.00    Years: 30.00    Pack years: 30.00    Types: Cigarettes  . Smokeless tobacco: Never Used  Substance and Sexual Activity  . Alcohol use: No  . Drug use: No  . Sexual activity: Yes  Other Topics Concern  . Not on file  Social History Narrative  . Not on file   Review of Systems  Constitutional: Negative for fatigue and fever.  Respiratory: Negative for cough and shortness of breath.   Cardiovascular: Negative for chest pain.  Gastrointestinal: Negative for abdominal pain.  Genitourinary: Negative for flank pain and hematuria.  Musculoskeletal: Negative for back pain.  Psychiatric/Behavioral: Negative for behavioral problems and confusion.    Vital Signs: There were no vitals taken for this visit.  Physical Exam  Constitutional: He is oriented to person, place, and time. He appears well-developed.  Cardiovascular: Normal rate,  regular rhythm and normal heart sounds.  Pulmonary/Chest: Effort normal and breath sounds normal. No respiratory distress.  Abdominal: Soft.  Neurological: He is alert and oriented to person, place, and time.  Skin: Skin is warm and dry.  Psychiatric: He has a normal mood and affect. His behavior is normal. Judgment and thought content normal.  Nursing note and vitals reviewed.   Imaging: No results  found.  Labs:  CBC: Recent Labs    05/19/17 1807 05/21/17 0522 06/22/17 0846 06/27/17 1006  WBC 15.0* 7.3 6.9 6.6  HGB 14.2 12.8* 16.2 15.4  HCT 40.7 38.2* 46.8 44.5  PLT 155 179 126* 121*    COAGS: No results for input(s): INR, APTT in the last 8760 hours.  BMP: Recent Labs    05/19/17 1807 05/21/17 0522 06/22/17 0846  NA 137 142 142  K 4.0 4.6 4.6  CL 99* 109 111  CO2 26 23 27   GLUCOSE 122* 172* 96  BUN 24* 28* 17  CALCIUM 9.1 8.9 9.7  CREATININE 1.85* 1.03 1.09  GFRNONAA 39* >60 >60  GFRAA 45* >60 >60    LIVER FUNCTION TESTS: No results for input(s): BILITOT, AST, ALT, ALKPHOS, PROT, ALBUMIN in the last 8760 hours.  TUMOR MARKERS: No results for input(s): AFPTM, CEA, CA199, CHROMGRNA in the last 8760 hours.  Assessment and Plan: Patient with past medical history of left renal calculi presents for left percutaneous nephroureteral stent placement at the request of Dr. Laverle PatterBorden. Patient presents today in their usual state of health.  He has been NPO and is not currently on blood thinners.  Note platelet count of 126.  Risks and benefits were discussed with the patient including, but not limited to, infection, bleeding, significant bleeding causing loss or decrease in renal function or damage to adjacent structures.   All of the patient's questions were answered, patient is agreeable to proceed.  Consent signed and in chart.  Thank you for this interesting consult.  I greatly enjoyed meeting Frederick Duke and look forward to participating in their care.  A copy of this report was sent to the requesting provider on this date.  Electronically Signed: Hoyt KochKacie Sue-Ellen Damion Kant, PA 06/27/2017, 10:27 AM   I spent a total of  30 Minutes   in face to face in clinical consultation, greater than 50% of which was counseling/coordinating care for renal calculi

## 2017-06-28 DIAGNOSIS — N202 Calculus of kidney with calculus of ureter: Secondary | ICD-10-CM | POA: Diagnosis not present

## 2017-06-28 LAB — HEMOGLOBIN AND HEMATOCRIT, BLOOD
HCT: 21.3 % — ABNORMAL LOW (ref 39.0–52.0)
HCT: 21.1 % — ABNORMAL LOW (ref 39.0–52.0)
HCT: 20.6 % — ABNORMAL LOW (ref 39.0–52.0)
HEMATOCRIT: 21.9 % — AB (ref 39.0–52.0)
HEMOGLOBIN: 7.5 g/dL — AB (ref 13.0–17.0)
Hemoglobin: 7.3 g/dL — ABNORMAL LOW (ref 13.0–17.0)
Hemoglobin: 7.3 g/dL — ABNORMAL LOW (ref 13.0–17.0)
Hemoglobin: 7 g/dL — ABNORMAL LOW (ref 13.0–17.0)

## 2017-06-28 LAB — BASIC METABOLIC PANEL
Anion gap: 4 — ABNORMAL LOW (ref 5–15)
BUN: 19 mg/dL (ref 6–20)
CALCIUM: 7.5 mg/dL — AB (ref 8.9–10.3)
CHLORIDE: 110 mmol/L (ref 101–111)
CO2: 23 mmol/L (ref 22–32)
CREATININE: 1.3 mg/dL — AB (ref 0.61–1.24)
GFR, EST NON AFRICAN AMERICAN: 59 mL/min — AB (ref >60)
Glucose, Bld: 181 mg/dL — ABNORMAL HIGH (ref 65–99)
Potassium: 4.8 mmol/L (ref 3.5–5.1)
SODIUM: 137 mmol/L (ref 135–145)

## 2017-06-28 NOTE — Discharge Summary (Signed)
  Date of admission: 06/27/2017  Date of discharge: 06/28/2017  Admission diagnosis: Left renal calculi  Discharge diagnosis: Left renal calculi  History and Physical: For full details, please see admission history and physical. Briefly, Frederick Duke is a 56 y.o. year old patient with large burden left renal calculi.   Hospital Course: He was taken to the IR suite on 06/27/17 and lower pole left renal access was obtained by Dr. Lowella DandyHenn.  He was then brought to the OR.  His renal access site was dilated.  His procedure revealed significant bleeding immediately and visualization was impaired.  Although a significant portion of his stone was able to be fragmented and removed, complete stone removal could not be confirmed and the procedure was stopped due to ongoing bleeding.  He was observed overnight and had a significant drop in his Hgb which eventually stabilized at 7.0.  He was hemodynamically stable and his urine from his urethral catheter and nephrostomy tube cleared.  He was able to be discharged on POD # 1.  Laboratory values:  Recent Labs    06/28/17 0512 06/28/17 1010 06/28/17 1455  HGB 7.3* 7.3* 7.0*  HCT 21.3* 21.1* 20.6*   Recent Labs    06/27/17 1647 06/28/17 0203  CREATININE 1.10 1.30*    Disposition: Home  Discharge instruction: The patient was instructed to be ambulatory but told to refrain from heavy lifting, strenuous activity, or driving.  Discharge medications:  Allergies as of 06/28/2017   No Known Allergies     Medication List    STOP taking these medications   ciprofloxacin 500 MG tablet Commonly known as:  CIPRO   docusate sodium 100 MG capsule Commonly known as:  COLACE   HYDROcodone-acetaminophen 5-325 MG tablet Commonly known as:  NORCO/VICODIN     TAKE these medications   oxyCODONE-acetaminophen 5-325 MG tablet Commonly known as:  ROXICET Take 1 tablet every 4 (four) hours as needed by mouth for severe pain.       Followup:   Follow-up Information    Heloise PurpuraBorden, Buna Cuppett, MD.   Specialty:  Urology Why:  Will call to arrange next procedure. Contact information: [address] [place] [address] [phone]

## 2017-06-28 NOTE — Progress Notes (Signed)
Patient ID: Frederick RobesJoseph L Duke, male   DOB: 12/29/1960, 56 y.o.   MRN: [id number]  1 Day Post-Op Subjective: Pt with expected pain at nephrostomy site but tolerable and controlled.  No fever overnight.  Objective: Vital signs in last 24 hours: Temp:  [97.6 F (36.4 C)-98.2 F (36.8 C)] 98.1 F (36.7 C) (11/20 0629) Pulse Rate:  [72-117] 103 (11/20 0629) Resp:  [9-17] 14 (11/20 0629) BP: (88-129)/(66-91) 101/68 (11/20 0629) SpO2:  [93 %-100 %] 99 % (11/20 0629) Weight:  [57.7 kg (127 lb 2 oz)-59.9 kg (132 lb 0.9 oz)] 59.9 kg (132 lb 0.9 oz) (11/19 1735)  Intake/Output from previous day: 11/19 0701 - 11/20 0700 In: 4007.5 [I.V.:4007.5] Out: 1950 [Urine:1700; Blood:250] Intake/Output this shift: No intake/output data recorded.  Physical Exam:  General: Alert and oriented CV: Regular rate and rhythm on exam this morning Lungs: Clear Abdomen: Soft, ND GU: L PCN site examined with no hematoma noted superficially.  Urine from PCN clearing and now mostly urine with red tinged urine.  Foley also clearing with red tinged urine consistent with old blood.   Lab Results: Recent Labs    06/27/17 2007 06/28/17 0203 06/28/17 0512  HGB 8.6* 7.5* 7.3*  HCT 25.6* 21.9* 21.3*   BMET Recent Labs    06/27/17 1647 06/28/17 0203  NA 137 137  K 3.7 4.8  CL 110 110  CO2 24 23  GLUCOSE 182* 181*  BUN 16 19  CREATININE 1.10 1.30*  CALCIUM 7.3* 7.5*     Studies/Results:   Assessment/Plan: POD # 1 s/p L PCNL - Hgb now stabilizing.  Will continue to monitor.  - D/C Foley - Ambulate - Hope for discharge later today - Plan for 2nd stage procedure next week    LOS: 0 days   Birgit Nowling,LES 06/28/2017, 7:42 AM

## 2017-06-28 NOTE — Plan of Care (Signed)
  Progressing Clinical Measurements: Postoperative complications will be avoided or minimized 06/28/2017 1120 - Progressing by Leshea Jaggers, Alben SpittleMary E, RN Skin Integrity: Demonstration of wound healing without infection will improve 06/28/2017 1120 - Progressing by Ligia Duguay, Alben SpittleMary E, RN Education: Knowledge of General Education information will improve 06/28/2017 1120 - Progressing by Camron Essman, Alben SpittleMary E, RN Health Behavior/Discharge Planning: Ability to manage health-related needs will improve 06/28/2017 1120 - Progressing by Cornesha Radziewicz, Alben SpittleMary E, RN Clinical Measurements: Ability to maintain clinical measurements within normal limits will improve 06/28/2017 1120 - Progressing by Kerria Sapien, Alben SpittleMary E, RN Will remain free from infection 06/28/2017 1120 - Progressing by Braxtyn Bojarski, Alben SpittleMary E, RN Diagnostic test results will improve 06/28/2017 1120 - Progressing by Ellah Otte, Alben SpittleMary E, RN Respiratory complications will improve 06/28/2017 1120 - Progressing by Loran Auguste, Alben SpittleMary E, RN Cardiovascular complication will be avoided 06/28/2017 1120 - Progressing by Maureen Duesing, Alben SpittleMary E, RN Activity: Risk for activity intolerance will decrease 06/28/2017 1120 - Progressing by Cuinn Westerhold, Alben SpittleMary E, RN Nutrition: Adequate nutrition will be maintained 06/28/2017 1120 - Progressing by Miron Marxen, Alben SpittleMary E, RN Elimination: Will not experience complications related to bowel motility 06/28/2017 1120 - Progressing by Akram Kissick, Alben SpittleMary E, RN Will not experience complications related to urinary retention 06/28/2017 1120 - Progressing by Shanee Batch, Alben SpittleMary E, RN Pain Managment: General experience of comfort will improve 06/28/2017 1120 - Progressing by Allen Basista, Alben SpittleMary E, RN Safety: Ability to remain free from injury will improve 06/28/2017 1120 - Progressing by Shereta Crothers, Alben SpittleMary E, RN Skin Integrity: Risk for impaired skin integrity will decrease 06/28/2017 1120 - Progressing by Larwence Tu, Alben SpittleMary E, RN

## 2017-06-28 NOTE — Discharge Instructions (Signed)
Please call if you develop fever > 101, pain that is not controlled with your pain medication, or difficulty emptying your bladder.

## 2017-06-28 NOTE — Care Management Note (Signed)
Case Management Note  Patient Details  Name: Frederick Duke MRN: [id number] Date of Birth: 11/06/1960  Subjective/Objective: 56 y/o m admitted w/renal calculus. From home. POD#1 L PCN.                   Action/Plan:d/c plan home.   Expected Discharge Date:                  Expected Discharge Plan:  Home/Self Care  In-House Referral:     Discharge planning Services  CM Consult  Post Acute Care Choice:    Choice offered to:     DME Arranged:    DME Agency:     HH Arranged:    HH Agency:     Status of Service:  In process, will continue to follow  If discussed at Long Length of Stay Meetings, dates discussed:    Additional Comments:  Frederick Duke, Frederick Hollon, RN 06/28/2017, 11:16 AM

## 2017-06-29 ENCOUNTER — Other Ambulatory Visit: Payer: Self-pay

## 2017-06-29 ENCOUNTER — Encounter (HOSPITAL_COMMUNITY): Payer: Self-pay

## 2017-06-29 ENCOUNTER — Observation Stay (HOSPITAL_COMMUNITY)
Admission: AD | Admit: 2017-06-29 | Discharge: 2017-06-30 | Disposition: A | Discharge status: Home / Self Care | Payer: BLUE CROSS/BLUE SHIELD | Source: Ambulatory Visit | Attending: Urology | Admitting: Urology

## 2017-06-29 DIAGNOSIS — Z87442 Personal history of urinary calculi: Secondary | ICD-10-CM | POA: Diagnosis not present

## 2017-06-29 DIAGNOSIS — F17210 Nicotine dependence, cigarettes, uncomplicated: Secondary | ICD-10-CM | POA: Insufficient documentation

## 2017-06-29 DIAGNOSIS — D62 Acute posthemorrhagic anemia: Secondary | ICD-10-CM | POA: Diagnosis not present

## 2017-06-29 DIAGNOSIS — R339 Retention of urine, unspecified: Secondary | ICD-10-CM | POA: Diagnosis not present

## 2017-06-29 DIAGNOSIS — Z936 Other artificial openings of urinary tract status: Secondary | ICD-10-CM | POA: Insufficient documentation

## 2017-06-29 DIAGNOSIS — R310 Gross hematuria: Secondary | ICD-10-CM | POA: Insufficient documentation

## 2017-06-29 DIAGNOSIS — Z9289 Personal history of other medical treatment: Secondary | ICD-10-CM

## 2017-06-29 HISTORY — DX: Personal history of other medical treatment: Z92.89

## 2017-06-29 LAB — TYPE AND SCREEN
ABO/RH(D): O POS
Antibody Screen: NEGATIVE

## 2017-06-29 LAB — CBC WITH DIFFERENTIAL/PLATELET
BASOS PCT: 0 %
Basophils Absolute: 0 10*3/uL (ref 0.0–0.1)
EOS ABS: 0 10*3/uL (ref 0.0–0.7)
Eosinophils Relative: 0 %
HCT: 16.6 % — ABNORMAL LOW (ref 39.0–52.0)
Hemoglobin: 5.7 g/dL — CL (ref 13.0–17.0)
Lymphocytes Relative: 8 %
Lymphs Abs: 1.1 10*3/uL (ref 0.7–4.0)
MCH: 31.5 pg (ref 26.0–34.0)
MCHC: 34.3 g/dL (ref 30.0–36.0)
MCV: 91.7 fL (ref 78.0–100.0)
MONO ABS: 0.5 10*3/uL (ref 0.1–1.0)
Monocytes Relative: 4 %
NEUTROS PCT: 88 %
Neutro Abs: 12 10*3/uL — ABNORMAL HIGH (ref 1.7–7.7)
Platelets: 91 10*3/uL — ABNORMAL LOW (ref 150–400)
RBC: 1.81 MIL/uL — ABNORMAL LOW (ref 4.22–5.81)
RDW: 14.7 % (ref 11.5–15.5)
WBC: 13.6 10*3/uL — ABNORMAL HIGH (ref 4.0–10.5)

## 2017-06-29 LAB — BASIC METABOLIC PANEL
ANION GAP: 5 (ref 5–15)
BUN: 20 mg/dL (ref 6–20)
CALCIUM: 8.3 mg/dL — AB (ref 8.9–10.3)
CHLORIDE: 107 mmol/L (ref 101–111)
CO2: 26 mmol/L (ref 22–32)
CREATININE: 1.32 mg/dL — AB (ref 0.61–1.24)
GFR calc Af Amer: >60 mL/min (ref >60)
GFR calc non Af Amer: 58 mL/min — ABNORMAL LOW (ref >60)
Glucose, Bld: 101 mg/dL — ABNORMAL HIGH (ref 65–99)
POTASSIUM: 3.9 mmol/L (ref 3.5–5.1)
Sodium: 138 mmol/L (ref 135–145)

## 2017-06-29 LAB — HEMOGLOBIN AND HEMATOCRIT, BLOOD
HCT: 20.9 % — ABNORMAL LOW (ref 39.0–52.0)
Hemoglobin: 7.2 g/dL — ABNORMAL LOW (ref 13.0–17.0)

## 2017-06-29 LAB — PREPARE RBC (CROSSMATCH)

## 2017-06-29 MED ORDER — SODIUM CHLORIDE 0.9 % IV SOLN
Freq: Once | INTRAVENOUS | Status: AC
  Administered 2017-06-29: 14:00:00 via INTRAVENOUS

## 2017-06-29 MED ORDER — OXYCODONE HCL 5 MG PO TABS
5.0000 mg | ORAL_TABLET | ORAL | Status: DC | PRN
  Administered 2017-06-29: 5 mg via ORAL
  Filled 2017-06-29: qty 1

## 2017-06-29 MED ORDER — POTASSIUM CHLORIDE IN NACL 20-0.45 MEQ/L-% IV SOLN
INTRAVENOUS | Status: DC
  Administered 2017-06-29 – 2017-06-30 (×2): via INTRAVENOUS
  Filled 2017-06-29 (×4): qty 1000

## 2017-06-29 MED ORDER — OXYBUTYNIN CHLORIDE 5 MG PO TABS: 5.0000 mg | ORAL_TABLET | Freq: Three times a day (TID) | ORAL | Status: DC | PRN

## 2017-06-29 MED ORDER — ACETAMINOPHEN 325 MG PO TABS
650.0000 mg | ORAL_TABLET | ORAL | Status: DC | PRN
  Administered 2017-06-29: 650 mg via ORAL
  Filled 2017-06-29: qty 2

## 2017-06-29 MED ORDER — DIPHENHYDRAMINE HCL 12.5 MG/5ML PO ELIX: 12.5000 mg | ORAL_SOLUTION | Freq: Four times a day (QID) | ORAL | Status: DC | PRN

## 2017-06-29 MED ORDER — SENNOSIDES-DOCUSATE SODIUM 8.6-50 MG PO TABS: 1.0000 | ORAL_TABLET | Freq: Every evening | ORAL | Status: DC | PRN

## 2017-06-29 MED ORDER — FLEET ENEMA 7-19 GM/118ML RE ENEM
1.0000 | ENEMA | Freq: Once | RECTAL | Status: DC | PRN
Start: 2017-06-29 — End: 2017-06-30

## 2017-06-29 MED ORDER — ZOLPIDEM TARTRATE 5 MG PO TABS: 5.0000 mg | ORAL_TABLET | Freq: Every evening | ORAL | Status: DC | PRN

## 2017-06-29 MED ORDER — HYDROMORPHONE HCL 1 MG/ML IJ SOLN
0.5000 mg | INTRAMUSCULAR | Status: DC | PRN
  Administered 2017-06-29 – 2017-06-30 (×2): 1 mg via INTRAVENOUS
  Filled 2017-06-29 (×2): qty 1

## 2017-06-29 MED ORDER — DIPHENHYDRAMINE HCL 50 MG/ML IJ SOLN: 12.5000 mg | Freq: Four times a day (QID) | INTRAMUSCULAR | Status: DC | PRN

## 2017-06-29 MED ORDER — ONDANSETRON HCL 4 MG/2ML IJ SOLN: 4.0000 mg | INTRAMUSCULAR | Status: DC | PRN

## 2017-06-29 MED ORDER — BISACODYL 10 MG RE SUPP: 10.0000 mg | Freq: Every day | RECTAL | Status: DC | PRN

## 2017-06-29 NOTE — Anesthesia Postprocedure Evaluation (Signed)
Anesthesia Post Note  Patient: Frederick Duke  Procedure(s) Performed: LEFT NEPHROLITHOTOMY PERCUTANEOUS (Left )     Patient location during evaluation: PACU Anesthesia Type: General Level of consciousness: awake and alert Pain management: pain level controlled Vital Signs Assessment: post-procedure vital signs reviewed and stable Respiratory status: spontaneous breathing, nonlabored ventilation, respiratory function stable and patient connected to nasal cannula oxygen Cardiovascular status: blood pressure returned to baseline and stable Postop Assessment: no apparent nausea or vomiting Anesthetic complications: no    Last Vitals:  Vitals:   06/28/17 0629 06/28/17 0833  BP: 101/68 112/62  Pulse: (!) 103 (!) 107  Resp: 14   Temp: 36.7 C   SpO2: 99% 97%    Last Pain:  Vitals:   06/28/17 0744  TempSrc:   PainSc: 5                  Kayon Dozier EDWARD

## 2017-06-29 NOTE — H&P (Signed)
Subjective: CC: weakness and flank pain.    Frederick Duke returned to the office today with left flank pain and leakage following a left PCNL on 11/19.   He was found to be hypotensive and tachycardic and a Hgb was 5.9 in the office.  It was 7 prior to discharge yesterday.   He had a bloody procedure per report.  He had a CT in the office which showed the left NT and stent in good position with only a small perinephric hematuria but he appeared to be in retention so a foley was placed.  He has moderate to severe left flank pain.   The urine in the NT bag is light pink and it is draining well.    ROS:  Review of Systems  Constitutional: Negative for chills and fever.  Respiratory: Negative for shortness of breath.   Cardiovascular: Negative for chest pain.  Gastrointestinal: Positive for abdominal pain.  Genitourinary: Positive for flank pain.  Neurological: Positive for weakness.  All other systems reviewed and are negative.   No Known Allergies  Past Medical History:  Diagnosis Date  . History of kidney stones     Past Surgical History:  Procedure Laterality Date  . CYSTOSCOPY W/ URETERAL STENT PLACEMENT Left 05/20/2017   Procedure: CYSTOSCOPY WITH STENT REPLACEMENT, RETROGRADE;  Surgeon: Heloise Purpura, MD;  Location: [facility];  Service: Urology;  Laterality: Left;  . HAND SURGERY Right 1999  . IR URETERAL STENT LEFT NEW ACCESS W/O SEP NEPHROSTOMY CATH  06/27/2017    Social History   Socioeconomic History  . Marital status: Married    Spouse name: Not on file  . Number of children: Not on file  . Years of education: Not on file  . Highest education level: Not on file  Social Needs  . Financial resource strain: Not hard at all  . Food insecurity - worry: Never true  . Food insecurity - inability: Never true  . Transportation needs - medical: No  . Transportation needs - non-medical: No  Occupational History  . Not on file  Tobacco Use  . Smoking status: Current Every  Day Smoker    Packs/day: 1.00    Years: 30.00    Pack years: 30.00    Types: Cigarettes  . Smokeless tobacco: Never Used  Substance and Sexual Activity  . Alcohol use: No  . Drug use: No  . Sexual activity: Yes  Other Topics Concern  . Not on file  Social History Narrative  . Not on file    No family history on file.  Anti-infectives: Anti-infectives (From admission, onward)   None      No current facility-administered medications for this encounter.      Objective: Vital signs in last 24 hours: Temp:  [98.8 F (37.1 C)] 98.8 F (37.1 C) (11/21 1146) Pulse Rate:  [141] 141 (11/21 1146) Resp:  [18] 18 (11/21 1146) BP: (100)/(72) 100/72 (11/21 1146) SpO2:  [97 %] 97 % (11/21 1146)  Intake/Output from previous day: No intake/output data recorded. Intake/Output this shift: Total I/O In: -  Out: 200 [Urine:200]   Physical Exam  Constitutional: He is oriented to person, place, and time and well-developed, well-nourished, and in no distress.  HENT:  Head: Normocephalic and atraumatic.  Neck: Normal range of motion. Neck supple.  Cardiovascular:  Sinus tachycardia.  No murmur.  Pulmonary/Chest: Effort normal and breath sounds normal. No respiratory distress.  Abdominal: Soft. There is tenderness (left flank with a left NT in place. ).  Genitourinary:  Genitourinary Comments: Foley is draining clear urine.   Musculoskeletal: Normal range of motion. He exhibits no edema.  Neurological: He is alert and oriented to person, place, and time.  Skin: Skin is warm and dry. There is pallor.  Psychiatric: Mood and affect normal.  Vitals reviewed.   Lab Results:  Recent Labs    06/27/17 1006  06/28/17 1010 06/28/17 1455  WBC 6.6  --   --   --   HGB 15.4   < > 7.3* 7.0*  HCT 44.5   < > 21.1* 20.6*  PLT 121*  --   --   --    < > = values in this interval not displayed.   BMET Recent Labs    06/27/17 1647 06/28/17 0203  NA 137 137  K 3.7 4.8  CL 110 110   CO2 24 23  GLUCOSE 182* 181*  BUN 16 19  CREATININE 1.10 1.30*  CALCIUM 7.3* 7.5*   PT/INR Recent Labs    06/27/17 1006  LABPROT 12.9  INR 0.98   ABG No results for input(s): PHART, HCO3 in the last 72 hours.  Invalid input(s): PCO2, PO2  Studies/Results: Dg C-arm Gt 120 Min-no Report  Result Date: 06/27/2017 Fluoroscopy was utilized by the requesting physician.  No radiographic interpretation.   Ir Ureteral Stent Left New Access W/o Sep Nephrostomy Cath  Result Date: 06/27/2017 INDICATION: 56 year old with left renal calculi. A left ureter stent was placed due to an obstructing UPJ stone. Patient presents for percutaneous nephrolithotomy procedure and needs percutaneous access. EXAM: PLACEMENT OF LEFT NEPHROURETERAL CATHETER WITH ULTRASOUND AND FLUOROSCOPIC GUIDANCE COMPARISON:  None. MEDICATIONS: Ciprofloxacin 400 mg; The antibiotic was administered in an appropriate time frame prior to skin puncture. ANESTHESIA/SEDATION: Fentanyl 100 mcg IV; Versed 2.0 mg IV Moderate Sedation Time:  33 minutes The patient was continuously monitored during the procedure by the interventional radiology nurse under my direct supervision. CONTRAST:  15 mL - administered into the collecting system(s) FLUOROSCOPY TIME:  Fluoroscopy Time: 7 minutes 30 seconds (36 mGy). COMPLICATIONS: None immediate. PROCEDURE: Informed written consent was obtained from the patient after a thorough discussion of the procedural risks, benefits and alternatives. All questions were addressed. Maximal Sterile Barrier Technique was utilized including caps, mask, sterile gowns, sterile gloves, sterile drape, hand hygiene and skin antiseptic. A timeout was performed prior to the initiation of the procedure. Patient was placed prone. Left flank was prepped and draped in a sterile fashion. Fluoroscopic image of the left kidney was obtained. Left kidney was evaluated with ultrasound. Skin and subcutaneous tissues were anesthetized with  1% lidocaine. Using ultrasound guidance, mildly dilated lower pole calyx was targeted with a 22 gauge Chiba needle. Needle was directed into the calyx and contrast was injected. The collecting system was opacified with contrast. Fluoroscopy demonstrated that the stone filled calyx in the lower pole was actually slightly anterior to the accessed calyx. Therefore, the lower pole calyx containing the stone was targeted with a second 22 gauge Chiba needle. Needle was directed into the calyx with fluoroscopic guidance. Wire was advanced into the calyx. An Accustick dilator set was placed. Bentson wire was advanced into the renal pelvis. Accustick dilator set was exchanged for a 5 [place] Kumpe catheter. Additional nephrostogram images were obtained through the Kumpe catheter. Catheter was advanced into the urinary bladder with a Bentson wire. Catheter was sutured to the skin and flushed with normal saline. Fluoroscopic and ultrasound images were taken and saved for documentation. FINDINGS: Two  large stones in the lower portion of the left kidney. Largest stone appears to be in the lower pole infundibulum region. Access was obtained from a lower pole calyx containing stone or stones. Catheter successfully advanced into the urinary bladder. IMPRESSION: Successful placement of a left percutaneous nephroureteral catheter using ultrasound and fluoroscopic guidance. Electronically Signed   By: Richarda OverlieAdam  Henn M.D.   On: 06/27/2017 15:08   I have reviewed his CT from today along with his Hgb.   Assessment: Symptomatic acute blood anemia with a hgb of 5.9 follow left PCNL.   Admitted for transfusion.     Urinary retention.  Will maintain foley drainage for now.      CC: Dr. Heloise PurpuraLester Borden.      Frederick PippinJohn Merridy Duke 06/29/2017 [phone]

## 2017-06-29 NOTE — Progress Notes (Signed)
After doing the 15 minute post blood start on patients 2nd unit of blood, pt temperature jumped up 1.9 degrees F.  Dr. Al CorpusNarang made aware.  Verbal order to give tylenol, stop blood, run IV fluids for 1 hour and recheck temperature in 1 hour.  If pt temp is <100F RN may resume blood.  Patient is experiencing no other reactions to blood, states he does not feel feverish.  Will continue to monitor closely.

## 2017-06-30 ENCOUNTER — Encounter (HOSPITAL_COMMUNITY): Payer: Self-pay | Admitting: Urology

## 2017-06-30 DIAGNOSIS — D62 Acute posthemorrhagic anemia: Secondary | ICD-10-CM | POA: Diagnosis not present

## 2017-06-30 LAB — BASIC METABOLIC PANEL
ANION GAP: 4 — AB (ref 5–15)
BUN: 17 mg/dL (ref 6–20)
CHLORIDE: 107 mmol/L (ref 101–111)
CO2: 24 mmol/L (ref 22–32)
Calcium: 7.8 mg/dL — ABNORMAL LOW (ref 8.9–10.3)
Creatinine, Ser: 1.03 mg/dL (ref 0.61–1.24)
GFR calc Af Amer: >60 mL/min (ref >60)
GFR calc non Af Amer: >60 mL/min (ref >60)
GLUCOSE: 112 mg/dL — AB (ref 65–99)
POTASSIUM: 3.9 mmol/L (ref 3.5–5.1)
Sodium: 135 mmol/L (ref 135–145)

## 2017-06-30 LAB — TYPE AND SCREEN
ABO/RH(D): O POS
ANTIBODY SCREEN: NEGATIVE
UNIT DIVISION: 0
Unit division: 0

## 2017-06-30 LAB — BPAM RBC
BLOOD PRODUCT EXPIRATION DATE: 201812122359
Blood Product Expiration Date: 201812122359
ISSUE DATE / TIME: 201811211644
ISSUE DATE / TIME: 201811211405
Unit Type and Rh: 5100
Unit Type and Rh: 5100

## 2017-06-30 LAB — CBC
HEMATOCRIT: 20.3 % — AB (ref 39.0–52.0)
HEMOGLOBIN: 7 g/dL — AB (ref 13.0–17.0)
MCH: 31.5 pg (ref 26.0–34.0)
MCHC: 34.5 g/dL (ref 30.0–36.0)
MCV: 91.4 fL (ref 78.0–100.0)
Platelets: 84 10*3/uL — ABNORMAL LOW (ref 150–400)
RBC: 2.22 MIL/uL — AB (ref 4.22–5.81)
RDW: 15.2 % (ref 11.5–15.5)
WBC: 10.7 10*3/uL — ABNORMAL HIGH (ref 4.0–10.5)

## 2017-06-30 LAB — HEMOGLOBIN AND HEMATOCRIT, BLOOD
HCT: 21.1 % — ABNORMAL LOW (ref 39.0–52.0)
Hemoglobin: 7.4 g/dL — ABNORMAL LOW (ref 13.0–17.0)

## 2017-06-30 LAB — HIV ANTIBODY (ROUTINE TESTING W REFLEX): HIV Screen 4th Generation wRfx: NONREACTIVE

## 2017-06-30 MED ORDER — CEPHALEXIN 500 MG PO CAPS: 500.0000 mg | ORAL_CAPSULE | Freq: Every day | ORAL | 0 refills | Status: DC

## 2017-06-30 NOTE — Discharge Summary (Signed)
Physician Discharge Summary  Patient ID: Frederick RobesJoseph L Duke MRN: [id number] DOB/AGE: 57/01/1961 57 y.o.  Admit date: 06/29/2017 Discharge date: 06/30/2017  Admission Diagnoses: Gross hematuria, acute blood loss anemia  Discharge Diagnoses:  Principal Problem:   Acute blood loss anemia Active Problems:   Acute blood loss as cause of postoperative anemia   Discharged Condition: good  Hospital Course: Patient was admitted with urinary retention and acute blood loss anemia.  He was admitted postop day 2 with acute blood loss anemia after a left PCNL.  PCNL was noted to be bloody.  His hemoglobin stabilized at 7 and he was discharged but returned to the office yesterday with abdominal pain, urinary retention, leaking around the nephrostomy tube and a hemoglobin of 5.7.  CT scan of the abdomen and pelvis was obtained which revealed a distended bladder, left Kumpe catheter in good position and left nephrostomy in good position.  There was a small amount of perinephric hematoma and fluid.  He was admitted and given 2 units of blood.  He had a fever when a unit of blood was started.  He was given acetaminophen and the transfusion held.  He defervesced in the transfusion was resumed.  He has had no other fever.  His hemoglobin was 7 after the blood and 7.4 when it was rechecked.  He has been ambulating and tolerating a regular diet.  He also had a bowel movement.  He has had no dizziness or chest pain and felt to be ready for discharge.  We will have him remove the Foley catheter in the morning and take a cephalexin once daily as he has indwelling nephrostomy tube and a catheter going in and out.  He was instructed on Foley removal.   Consults: None  Significant Diagnostic Studies: none  Treatments: Transfusion of 2 units  Discharge Exam: Blood pressure 90/64, pulse 100, temperature 98.3 F (36.8 C), temperature source Oral, resp. rate 16, height 5\' 9"  (1.753 m), weight 60.8 kg (134 lb), SpO2 100  %. NAD, no pallor, looks well - eating a sandwich  Alert and O x 3 Resp - regular effort and depth Abd - soft, NT Left Nx  -- good UOP, bag about full, urine light red - no clot or heavy bleeding Foley - urine clear    Disposition: 01-Home or Self Care   Allergies as of 06/30/2017   No Known Allergies     Medication List    TAKE these medications   cephALEXin 500 MG capsule Commonly known as:  KEFLEX Take 1 capsule (500 mg total) by mouth daily.   oxyCODONE-acetaminophen 5-325 MG tablet Commonly known as:  ROXICET Take 1 tablet every 4 (four) hours as needed by mouth for severe pain.      Follow-up Information    Heloise PurpuraBorden, Lester, MD. Call.   Specialty:  Urology Contact information: [address] [place] [address] [phone]           Signed: Jerilee FieldMatthew Malvika Tung 06/30/2017, 3:24 PM

## 2017-06-30 NOTE — Discharge Instructions (Signed)
Indwelling Urinary Catheter Care, Adult Take good care of your catheter to keep it working and to prevent problems.  REMOVE THE FOLEY in the morning, Jul 01, 2017 as instructed   How to wear your catheter Attach your catheter to your leg with tape (adhesive tape) or a leg strap. Make sure it is not too tight. If you use tape, remove any bits of tape that are already on the catheter. How to wear a drainage bag You should have:  A large overnight bag.  A small leg bag.  Overnight Bag You may wear the overnight bag at any time. Always keep the bag below the level of your bladder but off the floor. When you sleep, put a clean plastic bag in a wastebasket. Then hang the bag inside the wastebasket. Leg Bag Never wear the leg bag at night. Always wear the leg bag below your knee. Keep the leg bag secure with a leg strap or tape. How to care for your skin  Clean the skin around the catheter at least once every day.  Shower every day. Do not take baths.  Put creams, lotions, or ointments on your genital area only as told by your doctor.  Do not use powders, sprays, or lotions on your genital area. How to clean your catheter and your skin 1. Wash your hands with soap and water. 2. Wet a washcloth in warm water and gentle (mild) soap. 3. Use the washcloth to clean the skin where the catheter enters your body. Clean downward and wipe away from the catheter in small circles. Do not wipe toward the catheter. 4. Pat the area dry with a clean towel. Make sure to clean off all soap. How to care for your drainage bags Empty your drainage bag when it is ?- full or at least 2-3 times a day. Replace your drainage bag once a month or sooner if it starts to smell bad or look dirty. Do not clean your drainage bag unless told by your doctor. Emptying a drainage bag  Supplies Needed  Rubbing alcohol.  Gauze pad or cotton ball.  Tape or a leg strap.  Steps 1. Wash your hands with soap and  water. 2. Separate (detach) the bag from your leg. 3. Hold the bag over the toilet or a clean container. Keep the bag below your hips and bladder. This stops pee (urine) from going back into the tube. 4. Open the pour spout at the bottom of the bag. 5. Empty the pee into the toilet or container. Do not let the pour spout touch any surface. 6. Put rubbing alcohol on a gauze pad or cotton ball. 7. Use the gauze pad or cotton ball to clean the pour spout. 8. Close the pour spout. 9. Attach the bag to your leg with tape or a leg strap. 10. Wash your hands.  Changing a drainage bag Supplies Needed  Alcohol wipes.  A clean drainage bag.  Adhesive tape or a leg strap.  Steps 1. Wash your hands with soap and water. 2. Separate the dirty bag from your leg. 3. Pinch the rubber catheter with your fingers so that pee does not spill out. 4. Separate the catheter tube from the drainage tube where these tubes connect (at the connection valve). Do not let the tubes touch any surface. 5. Clean the end of the catheter tube with an alcohol wipe. Use a different alcohol wipe to clean the end of the drainage tube. 6. Connect the catheter tube  to the drainage tube of the clean bag. 7. Attach the new bag to the leg with adhesive tape or a leg strap. 8. Wash your hands.  How to prevent infection and other problems  Never pull on your catheter or try to remove it. Pulling can damage tissue in your body.  Always wash your hands before and after touching your catheter.  If a leg strap gets wet, replace it with a dry one.  Drink enough fluids to keep your pee clear or pale yellow, or as told by your doctor.  Do not let the drainage bag or tubing touch the floor.  Wear cotton underwear.  If you are male, wipe from front to back after you poop (have a bowel movement).  Check on the catheter often to make sure it works and the tubing is not twisted. Get help if:  Your pee is cloudy.  Your pee  smells unusually bad.  Your pee is not draining into the bag.  Your tube gets clogged.  Your catheter starts to leak.  Your bladder feels full. Get help right away if:  You have redness, swelling, or pain where the catheter enters your body.  You have fluid, pus, or a bad smell coming from the area where the catheter enters your body.  The area where the catheter enters your body feels warm.  You have a fever.  You have pain in your: ? Stomach (abdomen). ? Legs. ? Lower back. ? Bladder.  You see blood fill the catheter.  Your pee is pink or red.  You feel sick to your stomach (nauseous).  You throw up (vomit).  You have chills.  Your catheter gets pulled out. This information is not intended to replace advice given to you by your health care provider. Make sure you discuss any questions you have with your health care provider. Document Released: 11/20/2012 Document Revised: 06/23/2016 Document Reviewed: 01/08/2014 Elsevier Interactive Patient Education  2018 [employer].   Percutaneous Nephrostomy Home Guide Percutaneous nephrostomy is a procedure to insert a flexible tube into your kidney so that urine can leave your body. This procedure may be done if a medical condition prevents urine from leaving your kidney in the usual way. During the procedure, the nephrostomy tube is inserted in the right or left side of your lower back and is connected to an external drainage bag. After you have a nephrostomy tube placed, urine will collect in the drainage bag outside of your body. You will need to empty and change the drainage bag as needed. You will also need to take steps to care for the area where the nephrostomy tube was inserted (tube insertion site). How do I care for my nephrostomy tube?  Always keep your tubing, the leg bag, or the bedside drainage bag below the level of your kidney so that your urine drains freely.  Avoid activities that would cause bending or  pulling of your tubing. Ask your health care provider what activities are safe for you.  When connecting your nephrostomy tube to a drainage bag, make sure that there are no kinks in the tubing and that your urine is draining freely. You may want to gently wrap an elastic bandage over the tubing. This will help keep the tubing in place and prevent it from kinking. Make sure there is no tension on the tubing so it does not become dislodged.  At night, you may want to connect your nephrostomy tube or the leg bag to a  larger bedside drainage bag. How do I empty the drainage bag? Empty the leg bag or bedside drainage bag whenever it becomes ? full. Also empty it before you go to sleep. Most drainage bags have a drain at the bottom that allows urine to be emptied. Follow these basic steps: 1. Hold the drainage bag over a toilet or collection container. Use a measuring container if your health care provider told you to measure your urine. 2. Open the drain of the bag and allow the urine to drain out. 3. After all the urine has drained from the drainage bag, close the drain fully. 4. Flush the urine down the toilet. If a collection container was used, rinse the container.  How do I change the dressing around the nephrostomy tube? Change your dressing and clean your tube exit site as told by your health care provider. You may need to change the dressing every day for the first 2 weeks after having a nephrostomy tube inserted. After the first 2 weeks, you may be told to change the dressing two times a week. Supplies needed:  Mild soap and water.  Split gauze pads, 4  4 inches (10 x 10 cm).  Gauze pads, 4  4 inches (10 x 10 cm).  Paper tape. How to change the dressing: Because of the location of your nephrostomy tube, you may need help from another person to complete dressing changes. Follow these basic steps: 1. Wash hands with soap and water. 2. Gently remove the tape and dressing from around the  nephrostomy tube. Be careful not to pull on the tube while removing the dressing. Avoid using scissors because they may damage the tube. 3. Wash the skin around the tube with mild soap and water, rinse well, and pat the skin dry with a clean cloth. 4. Check the skin around the drain for redness, swelling, pus, warmth, or a bad smell. 5. If the drain was sutured to the skin, check the suture to verify that it is still anchored in the skin. 6. Place two split gauze pads in and around the tube exit site. Do not apply ointments or alcohol to the site. 7. Place a gauze pad on top of the split gauze pad. 8. Coil the tube on top of the gauze. The tubing should rest on the gauze, not on the skin. 9. Place tape around each edge of the gauze pad. 10. Secure the nephrostomy tubing. Make sure that the tube does not kink or become pinched. The tubing should rest on the gauze pad, not on the skin. 11. Dispose of used supplies properly.  How do I flush my nephrostomy tube? Use a saline syringe to rinse out (flush) your nephrostomy tube as told by your health care provider. Flushing is easier if a three-way stopcock is placed between the tube and the drainage bag. One connection of the stopcock connects to your tube, the second connects to the drainage bag, and the third is usually covered with a cap. The lever on the stopcock points to the direction on the stopcock that is closed to flow. Normally, the lever points in the direction of the cap to allow urine to drain from the tube to the drainage bag. Supplies needed:  Rubbing alcohol wipe.  10 mL 0.9% saline syringe. How to flush the tube: 1. Move the lever of the three-way stopcock so it points toward the drainage bag. 2. Clean the cap with a rubbing alcohol wipe. 3. Screw the tip of a  10 mL 0.9% saline syringe onto the cap. 4. Using the syringe plunger, slowly push the 10 mL 0.9% saline in the syringe over 5-10 seconds. If resistance is met or pain occurs  while pushing, stop pushing the saline. 5. Remove the syringe from the cap. 6. Return the stopcock lever to the usual position, pointing in the direction of the cap. 7. Dispose of used supplies properly. How do I replace the drainage bag? Replace the drainage bag, three-way stopcock, and any extension tubing as told by your health care provider. Make sure you always have an extra drainage bag and connecting tubing available. 1. Empty urine from your drainage bag. 2. Gather a new drainage bag, three-way stopcock, and any extension tubing. 3. Remove the drainage bag, three-way stopcock, and any extension tubing from the nephrostomy tube. 4. Attach the new leg bag or bedside drainage bag, three-way stopcock, and any extension tubing to the nephrostomy tube. 5. Dispose of the used drainage bag, three-way stopcock, and any extension.  Contact a health care provider if:  You have problems with any of the valves or tubing.  You have persistent pain or soreness in your back.  You have more redness, swelling, or pain around your tube insertion site.  You have more fluid or blood coming from your tube insertion site.  Your tube insertion site feels warm to the touch.  You have pus or a bad smell coming from your tube insertion site.  You have increased urine output or you feel burning when urinating. Get help right away if:  You have pain in your abdomen during the first week.  You have chest pain or have trouble breathing.  You have a new appearance of blood in your urine.  You have a fever or chills.  You have back pain that is not relieved by your medicine.  You have decreased urine output.  Your nephrostomy tube comes out. This information is not intended to replace advice given to you by your health care provider. Make sure you discuss any questions you have with your health care provider. Document Released: 05/16/2013 Document Revised: 05/07/2016 Document Reviewed:  05/07/2016 Elsevier Interactive Patient Education  [employer].

## 2017-06-30 NOTE — Progress Notes (Signed)
Patient given discharge, follow up, medication, and foley care instructions, verbalized understanding, IV removed, prescription and personal belongings with patient, family to transport home

## 2017-07-05 ENCOUNTER — Encounter (HOSPITAL_COMMUNITY): Payer: Self-pay | Admitting: *Deleted

## 2017-07-07 ENCOUNTER — Ambulatory Visit (HOSPITAL_COMMUNITY): Payer: BLUE CROSS/BLUE SHIELD | Admitting: Anesthesiology

## 2017-07-07 ENCOUNTER — Other Ambulatory Visit: Payer: Self-pay

## 2017-07-07 ENCOUNTER — Encounter (HOSPITAL_COMMUNITY): Payer: Self-pay | Admitting: *Deleted

## 2017-07-07 ENCOUNTER — Observation Stay (HOSPITAL_COMMUNITY)
Admission: RE | Admit: 2017-07-07 | Discharge: 2017-07-08 | Disposition: A | Discharge status: Home / Self Care | Payer: BLUE CROSS/BLUE SHIELD | Source: Ambulatory Visit | Attending: Urology | Admitting: Urology

## 2017-07-07 ENCOUNTER — Ambulatory Visit (HOSPITAL_COMMUNITY): Payer: BLUE CROSS/BLUE SHIELD

## 2017-07-07 ENCOUNTER — Encounter (HOSPITAL_COMMUNITY)
Admission: RE | Disposition: A | Discharge status: Home / Self Care | Payer: Self-pay | Source: Ambulatory Visit | Attending: Urology

## 2017-07-07 DIAGNOSIS — Z87891 Personal history of nicotine dependence: Secondary | ICD-10-CM | POA: Insufficient documentation

## 2017-07-07 DIAGNOSIS — N200 Calculus of kidney: Principal | ICD-10-CM | POA: Insufficient documentation

## 2017-07-07 DIAGNOSIS — R339 Retention of urine, unspecified: Secondary | ICD-10-CM | POA: Diagnosis not present

## 2017-07-07 HISTORY — PX: NEPHROLITHOTOMY: SHX5134

## 2017-07-07 HISTORY — DX: Personal history of other medical treatment: Z92.89

## 2017-07-07 LAB — CBC
HCT: 28.6 % — ABNORMAL LOW (ref 39.0–52.0)
Hemoglobin: 9.2 g/dL — ABNORMAL LOW (ref 13.0–17.0)
MCH: 30.5 pg (ref 26.0–34.0)
MCHC: 32.2 g/dL (ref 30.0–36.0)
MCV: 94.7 fL (ref 78.0–100.0)
PLATELETS: 440 10*3/uL — AB (ref 150–400)
RBC: 3.02 MIL/uL — AB (ref 4.22–5.81)
RDW: 14.7 % (ref 11.5–15.5)
WBC: 10 10*3/uL (ref 4.0–10.5)

## 2017-07-07 LAB — BASIC METABOLIC PANEL
Anion gap: 7 (ref 5–15)
BUN: 21 mg/dL — AB (ref 6–20)
CALCIUM: 8.9 mg/dL (ref 8.9–10.3)
CO2: 26 mmol/L (ref 22–32)
CREATININE: 0.98 mg/dL (ref 0.61–1.24)
Chloride: 106 mmol/L (ref 101–111)
GFR calc non Af Amer: >60 mL/min (ref >60)
Glucose, Bld: 102 mg/dL — ABNORMAL HIGH (ref 65–99)
Potassium: 3.8 mmol/L (ref 3.5–5.1)
SODIUM: 139 mmol/L (ref 135–145)

## 2017-07-07 LAB — HEMOGLOBIN AND HEMATOCRIT, BLOOD
HCT: 25.1 % — ABNORMAL LOW (ref 39.0–52.0)
Hemoglobin: 8.2 g/dL — ABNORMAL LOW (ref 13.0–17.0)

## 2017-07-07 LAB — TYPE AND SCREEN
ABO/RH(D): O POS
Antibody Screen: NEGATIVE

## 2017-07-07 SURGERY — NEPHROLITHOTOMY
Anesthesia: General | Site: Back | Laterality: Left

## 2017-07-07 MED ORDER — PHENYLEPHRINE 40 MCG/ML (10ML) SYRINGE FOR IV PUSH (FOR BLOOD PRESSURE SUPPORT)
PREFILLED_SYRINGE | INTRAVENOUS | Status: DC | PRN
  Administered 2017-07-07 (×2): 80 ug via INTRAVENOUS
  Administered 2017-07-07: 120 ug via INTRAVENOUS
  Administered 2017-07-07: 40 ug via INTRAVENOUS
  Administered 2017-07-07: 120 ug via INTRAVENOUS
  Administered 2017-07-07: 80 ug via INTRAVENOUS
  Administered 2017-07-07: 40 ug via INTRAVENOUS

## 2017-07-07 MED ORDER — DIPHENHYDRAMINE HCL 50 MG/ML IJ SOLN: 12.5000 mg | Freq: Four times a day (QID) | INTRAMUSCULAR | Status: DC | PRN

## 2017-07-07 MED ORDER — OXYCODONE-ACETAMINOPHEN 5-325 MG PO TABS: 1.0000 | ORAL_TABLET | ORAL | Status: DC | PRN

## 2017-07-07 MED ORDER — ONDANSETRON HCL 4 MG/2ML IJ SOLN
INTRAMUSCULAR | Status: AC
  Filled 2017-07-07: qty 2

## 2017-07-07 MED ORDER — FENTANYL CITRATE (PF) 100 MCG/2ML IJ SOLN: 25.0000 ug | INTRAMUSCULAR | Status: DC | PRN

## 2017-07-07 MED ORDER — DIPHENHYDRAMINE HCL 12.5 MG/5ML PO ELIX: 12.5000 mg | ORAL_SOLUTION | Freq: Four times a day (QID) | ORAL | Status: DC | PRN

## 2017-07-07 MED ORDER — SENNOSIDES-DOCUSATE SODIUM 8.6-50 MG PO TABS
2.0000 | ORAL_TABLET | Freq: Every day | ORAL | Status: DC
  Administered 2017-07-07: 2 via ORAL
  Filled 2017-07-07: qty 2

## 2017-07-07 MED ORDER — MIDAZOLAM HCL 5 MG/5ML IJ SOLN
INTRAMUSCULAR | Status: DC | PRN
  Administered 2017-07-07: 2 mg via INTRAVENOUS

## 2017-07-07 MED ORDER — DEXTROSE 5 % IV SOLN
2.0000 g | Freq: Once | INTRAVENOUS | Status: AC
  Administered 2017-07-07: 2 g via INTRAVENOUS
  Filled 2017-07-07: qty 2

## 2017-07-07 MED ORDER — SODIUM CHLORIDE 0.9% FLUSH: 3.0000 mL | INTRAVENOUS | Status: DC | PRN

## 2017-07-07 MED ORDER — SODIUM CHLORIDE 0.9 % IR SOLN
Status: DC | PRN
  Administered 2017-07-07: 6000 mL

## 2017-07-07 MED ORDER — SODIUM CHLORIDE 0.9% FLUSH: 3.0000 mL | Freq: Two times a day (BID) | INTRAVENOUS | Status: DC

## 2017-07-07 MED ORDER — ROCURONIUM BROMIDE 10 MG/ML (PF) SYRINGE
PREFILLED_SYRINGE | INTRAVENOUS | Status: DC | PRN
Start: 2017-07-07 — End: 2017-07-07
  Administered 2017-07-07: 10 mg via INTRAVENOUS
  Administered 2017-07-07: 40 mg via INTRAVENOUS
  Administered 2017-07-07: 10 mg via INTRAVENOUS

## 2017-07-07 MED ORDER — ROCURONIUM BROMIDE 50 MG/5ML IV SOSY
PREFILLED_SYRINGE | INTRAVENOUS | Status: AC
  Filled 2017-07-07: qty 5

## 2017-07-07 MED ORDER — FENTANYL CITRATE (PF) 100 MCG/2ML IJ SOLN
INTRAMUSCULAR | Status: DC | PRN
  Administered 2017-07-07: 100 ug via INTRAVENOUS

## 2017-07-07 MED ORDER — SUGAMMADEX SODIUM 200 MG/2ML IV SOLN
INTRAVENOUS | Status: DC | PRN
  Administered 2017-07-07: 200 mg via INTRAVENOUS

## 2017-07-07 MED ORDER — SODIUM CHLORIDE 0.9 % IV SOLN: 250.0000 mL | INTRAVENOUS | Status: DC | PRN

## 2017-07-07 MED ORDER — FENTANYL CITRATE (PF) 100 MCG/2ML IJ SOLN
INTRAMUSCULAR | Status: AC
  Filled 2017-07-07: qty 2

## 2017-07-07 MED ORDER — EPHEDRINE 5 MG/ML INJ
INTRAVENOUS | Status: AC
  Filled 2017-07-07: qty 10

## 2017-07-07 MED ORDER — PROPOFOL 10 MG/ML IV BOLUS
INTRAVENOUS | Status: DC | PRN
Start: 2017-07-07 — End: 2017-07-07
  Administered 2017-07-07: 40 mg via INTRAVENOUS
  Administered 2017-07-07: 120 mg via INTRAVENOUS

## 2017-07-07 MED ORDER — LIDOCAINE 2% (20 MG/ML) 5 ML SYRINGE
INTRAMUSCULAR | Status: DC | PRN
  Administered 2017-07-07: 60 mg via INTRAVENOUS

## 2017-07-07 MED ORDER — ONDANSETRON HCL 4 MG/2ML IJ SOLN: 4.0000 mg | INTRAMUSCULAR | Status: DC | PRN

## 2017-07-07 MED ORDER — DEXAMETHASONE SODIUM PHOSPHATE 10 MG/ML IJ SOLN
INTRAMUSCULAR | Status: DC | PRN
  Administered 2017-07-07: 10 mg via INTRAVENOUS

## 2017-07-07 MED ORDER — IOHEXOL 300 MG/ML  SOLN
INTRAMUSCULAR | Status: DC | PRN
  Administered 2017-07-07: 10 mL

## 2017-07-07 MED ORDER — TAMSULOSIN HCL 0.4 MG PO CAPS
0.4000 mg | ORAL_CAPSULE | Freq: Every day | ORAL | Status: DC
  Administered 2017-07-07 – 2017-07-08 (×2): 0.4 mg via ORAL
  Filled 2017-07-07 (×2): qty 1

## 2017-07-07 MED ORDER — ONDANSETRON HCL 4 MG/2ML IJ SOLN
INTRAMUSCULAR | Status: DC | PRN
  Administered 2017-07-07: 4 mg via INTRAVENOUS

## 2017-07-07 MED ORDER — ZOLPIDEM TARTRATE 5 MG PO TABS: 5.0000 mg | ORAL_TABLET | Freq: Every evening | ORAL | Status: DC | PRN

## 2017-07-07 MED ORDER — PROPOFOL 10 MG/ML IV BOLUS
INTRAVENOUS | Status: AC
Start: 2017-07-07 — End: ?
  Filled 2017-07-07: qty 20

## 2017-07-07 MED ORDER — PHENYLEPHRINE 40 MCG/ML (10ML) SYRINGE FOR IV PUSH (FOR BLOOD PRESSURE SUPPORT)
PREFILLED_SYRINGE | INTRAVENOUS | Status: AC
  Filled 2017-07-07: qty 20

## 2017-07-07 MED ORDER — EPHEDRINE SULFATE-NACL 50-0.9 MG/10ML-% IV SOSY
PREFILLED_SYRINGE | INTRAVENOUS | Status: DC | PRN
  Administered 2017-07-07: 10 mg via INTRAVENOUS

## 2017-07-07 MED ORDER — LACTATED RINGERS IV SOLN
INTRAVENOUS | Status: DC
  Administered 2017-07-07: 14:00:00 via INTRAVENOUS

## 2017-07-07 MED ORDER — MIDAZOLAM HCL 2 MG/2ML IJ SOLN
INTRAMUSCULAR | Status: AC
Start: 2017-07-07 — End: ?
  Filled 2017-07-07: qty 2

## 2017-07-07 MED ORDER — SUGAMMADEX SODIUM 200 MG/2ML IV SOLN
INTRAVENOUS | Status: AC
  Filled 2017-07-07: qty 2

## 2017-07-07 SURGICAL SUPPLY — 42 items
APL SKNCLS STERI-STRIP NONHPOA (GAUZE/BANDAGES/DRESSINGS) ×2
BAG URINE DRAINAGE (UROLOGICAL SUPPLIES) ×1 IMPLANT
BASKET STONE NITINOL 3FRX115MB (UROLOGICAL SUPPLIES) IMPLANT
BENZOIN TINCTURE PRP APPL 2/3 (GAUZE/BANDAGES/DRESSINGS) ×6 IMPLANT
CATCHER STONE W/TUBE ADAPTER (UROLOGICAL SUPPLIES) ×1 IMPLANT
CATH FOLEY 2W COUNCIL 20FR 5CC (CATHETERS) IMPLANT
CATH ROBINSON RED A/P 20FR (CATHETERS) IMPLANT
CATH X-FORCE N30 NEPHROSTOMY (TUBING) ×1 IMPLANT
COVER SURGICAL LIGHT HANDLE (MISCELLANEOUS) ×1 IMPLANT
DRAPE C-ARM 42X120 X-RAY (DRAPES) ×3 IMPLANT
DRAPE LINGEMAN PERC (DRAPES) ×3 IMPLANT
DRAPE SURG IRRIG POUCH 19X23 (DRAPES) ×3 IMPLANT
DRAPE UTILITY XL STRL (DRAPES) ×3 IMPLANT
DRSG PAD ABDOMINAL 8X10 ST (GAUZE/BANDAGES/DRESSINGS) ×6 IMPLANT
DRSG TEGADERM 8X12 (GAUZE/BANDAGES/DRESSINGS) ×4 IMPLANT
FIBER LASER FLEXIVA 1000 (UROLOGICAL SUPPLIES) IMPLANT
FIBER LASER FLEXIVA 365 (UROLOGICAL SUPPLIES) IMPLANT
FIBER LASER FLEXIVA 550 (UROLOGICAL SUPPLIES) IMPLANT
FIBER LASER TRAC TIP (UROLOGICAL SUPPLIES) IMPLANT
GAUZE SPONGE 4X4 12PLY STRL (GAUZE/BANDAGES/DRESSINGS) ×3 IMPLANT
GLOVE BIOGEL M STRL SZ7.5 (GLOVE) ×3 IMPLANT
GOWN STRL REUS W/TWL LRG LVL3 (GOWN DISPOSABLE) ×3 IMPLANT
KIT BASIN OR (CUSTOM PROCEDURE TRAY) ×3 IMPLANT
MANIFOLD NEPTUNE II (INSTRUMENTS) ×3 IMPLANT
NS IRRIG 1000ML POUR BTL (IV SOLUTION) ×3 IMPLANT
PACK CYSTO (CUSTOM PROCEDURE TRAY) ×3 IMPLANT
PAD ABD 7.5X8 STRL (GAUZE/BANDAGES/DRESSINGS) ×2 IMPLANT
PROBE LITHOCLAST ULTRA 3.8X403 (UROLOGICAL SUPPLIES) IMPLANT
PROBE PNEUMATIC 1.0MMX570MM (UROLOGICAL SUPPLIES) ×1 IMPLANT
SET IRRIG Y TYPE TUR BLADDER L (SET/KITS/TRAYS/PACK) ×3 IMPLANT
SPONGE LAP 4X18 X RAY DECT (DISPOSABLE) ×3 IMPLANT
STENT ENDOURETEROTOMY 7-14 26C (STENTS) IMPLANT
STONE CATCHER W/TUBE ADAPTER (UROLOGICAL SUPPLIES) IMPLANT
SUT SILK 2 0 30  PSL (SUTURE)
SUT SILK 2 0 30 PSL (SUTURE) ×1 IMPLANT
SYR 10ML LL (SYRINGE) ×3 IMPLANT
SYR 20CC LL (SYRINGE) ×6 IMPLANT
TOWEL OR NON WOVEN STRL DISP B (DISPOSABLE) ×3 IMPLANT
TRAY FOLEY W/METER SILVER 16FR (SET/KITS/TRAYS/PACK) ×3 IMPLANT
TUBING CONNECTING 10 (TUBING) ×6 IMPLANT
TUBING CONNECTING 10' (TUBING) ×3
WATER STERILE IRR 1000ML POUR (IV SOLUTION) ×3 IMPLANT

## 2017-07-07 NOTE — Transfer of Care (Signed)
Immediate Anesthesia Transfer of Care Note  Patient: Frederick Duke  Procedure(s) Performed: NEPHROLITHOTOMY/ SECOND STAGE (Left Back)  Patient Location: PACU  Anesthesia Type:General  Level of Consciousness: awake, alert  and oriented  Airway & Oxygen Therapy: Patient Spontanous Breathing and Patient connected to face mask oxygen  Post-op Assessment: Report given to RN and Post -op Vital signs reviewed and stable  Post vital signs: Reviewed and stable  Last Vitals:  Vitals:   07/07/17 1154  BP: 121/81  Pulse: 91  Resp: 16  Temp: 36.8 C  SpO2: 100%    Last Pain:  Vitals:   07/07/17 1154  TempSrc: Oral         Complications: No apparent anesthesia complications

## 2017-07-07 NOTE — Op Note (Signed)
Preoperative diagnosis: Left renal calculi  Postoperative diagnosis: Left renal calculi  Procedure: Second stage left percutaneous nephrostolithotomy  Surgeon: Dr. Rolly SalterLester S Lavonta Tillis, Montez HagemanJr  Anesthesia: General  Complications: None  EBL: Minimal  Indication: [name] is a 56 year old gentleman with large burden left renal calculi.  He underwent first stage left percutaneous nephrostolithotomy a little over 1 week ago.  He did have significant bleeding postoperatively and required transfusion.  His procedure was stopped due to intraoperative bleeding.  He presents today for a second stage procedure.  Fortunately, he does have fairly minimal stone burden based on his recent CT scan.  We reviewed the potential risks, complications, and the expected recovery process of associated with this procedure.  He gave informed consent to proceed.  Description of procedure: The patient was taken to the operating room and a general anesthetic was administered.  He was given preoperative antibiotics, placed in the prone position, and prepped and draped in the usual sterile fashion.  Next, a preoperative timeout was performed.  Through the existing nephrostomy site, the indwelling nephrostomy tube was removed and a 16 French flexible nephroscope was passed through the nephrostomy tract.  The urine was quite clear and visualization was excellent.  There were noted to be a few remaining stone fragments which were able to be removed with a 0 tip nitinol basket.  The remainder of the renal collecting system was then examined under direct visualization without any additional stones noted.  With the help of intraoperative fluoroscopy, it was certain that all renal calyces were examined.  At this point, the procedure was ended.  The nephroscope was removed and the nephrostomy tract was left open.  A sterile dressing was applied.  The patient's nephroureteral catheter was left in place.  The patient tolerated the procedure  well without complications.  He was able to be extubated and transferred to the recovery unit in satisfactory condition.

## 2017-07-07 NOTE — Anesthesia Procedure Notes (Signed)
Procedure Name: Intubation Performed by: Gean Maidens, CRNA Pre-anesthesia Checklist: Patient identified, Emergency Drugs available, Suction available, Patient being monitored and Timeout performed Patient Re-evaluated:Patient Re-evaluated prior to induction Oxygen Delivery Method: Circle system utilized Preoxygenation: Pre-oxygenation with 100% oxygen Induction Type: IV induction Ventilation: Mask ventilation without difficulty Laryngoscope Size: Mac and 3 Grade View: Grade I Tube type: Oral Tube size: 7.0 mm Number of attempts: 1 Airway Equipment and Method: Stylet Placement Confirmation: ETT inserted through vocal cords under direct vision,  positive ETCO2,  CO2 detector and breath sounds checked- equal and bilateral Secured at: 23 cm Tube secured with: Tape Dental Injury: Teeth and Oropharynx as per pre-operative assessment

## 2017-07-07 NOTE — Discharge Instructions (Signed)
1. You may see some blood in the urine and may have some burning with urination for 48-72 hours. You also may notice that you have to urinate more frequently or urgently after your procedure which is normal.  °2. You should call should you develop an inability urinate, fever > 101, persistent nausea and vomiting that prevents you from eating or drinking to stay hydrated.  °

## 2017-07-07 NOTE — Anesthesia Preprocedure Evaluation (Signed)
Anesthesia Evaluation  Patient identified by MRN, date of birth, ID band Patient awake    Reviewed: Allergy & Precautions, H&P , NPO status , Patient's Chart, lab work & pertinent test results  Airway Mallampati: II  TM Distance: >3 FB Neck ROM: Full    Dental no notable dental hx. (+) Poor Dentition, Dental Advisory Given   Pulmonary Current Smoker,    Pulmonary exam normal breath sounds clear to auscultation       Cardiovascular negative cardio ROS   Rhythm:Regular Rate:Normal     Neuro/Psych negative neurological ROS  negative psych ROS   GI/Hepatic negative GI ROS, Neg liver ROS,   Endo/Other  negative endocrine ROS  Renal/GU negative Renal ROS  negative genitourinary   Musculoskeletal   Abdominal   Peds  Hematology negative hematology ROS (+) anemia ,   Anesthesia Other Findings Hb 9.2  Reproductive/Obstetrics negative OB ROS                             Anesthesia Physical  Anesthesia Plan  ASA: II  Anesthesia Plan: General   Post-op Pain Management:    Induction: Intravenous  PONV Risk Score and Plan: 2 and Ondansetron, Dexamethasone, Midazolam and Treatment may vary due to age or medical condition  Airway Management Planned: Oral ETT  Additional Equipment:   Intra-op Plan:   Post-operative Plan: Extubation in OR  Informed Consent: I have reviewed the patients History and Physical, chart, labs and discussed the procedure including the risks, benefits and alternatives for the proposed anesthesia with the patient or authorized representative who has indicated his/her understanding and acceptance.   Dental advisory given  Plan Discussed with: CRNA  Anesthesia Plan Comments:         Anesthesia Quick Evaluation

## 2017-07-07 NOTE — Interval H&P Note (Signed)
History and Physical Interval Note:  07/07/2017 1:28 PM  Frederick Duke  has presented today for surgery, with the diagnosis of LEFT RENAL CALCULI.  He presents today for his 2nd stage procedure.  The various methods of treatment have been discussed with the patient and family. After consideration of risks, benefits and other options for treatment, the patient has consented to  Procedure(s): NEPHROLITHOTOMY/ SECOND STAGE (Left) as a surgical intervention .  The patient's history has been reviewed, patient examined, no change in status, stable for surgery.  I have reviewed the patient's chart and labs.  Questions were answered to the patient's satisfaction.     Shaneca Orne,LES

## 2017-07-08 DIAGNOSIS — N200 Calculus of kidney: Secondary | ICD-10-CM | POA: Diagnosis not present

## 2017-07-08 LAB — BASIC METABOLIC PANEL
ANION GAP: 7 (ref 5–15)
BUN: 19 mg/dL (ref 6–20)
CO2: 24 mmol/L (ref 22–32)
Calcium: 8.7 mg/dL — ABNORMAL LOW (ref 8.9–10.3)
Chloride: 109 mmol/L (ref 101–111)
Creatinine, Ser: 0.93 mg/dL (ref 0.61–1.24)
GFR calc Af Amer: >60 mL/min (ref >60)
GFR calc non Af Amer: >60 mL/min (ref >60)
GLUCOSE: 147 mg/dL — AB (ref 65–99)
POTASSIUM: 4.4 mmol/L (ref 3.5–5.1)
Sodium: 140 mmol/L (ref 135–145)

## 2017-07-08 LAB — HEMOGLOBIN AND HEMATOCRIT, BLOOD
HCT: 25.5 % — ABNORMAL LOW (ref 39.0–52.0)
Hemoglobin: 8.4 g/dL — ABNORMAL LOW (ref 13.0–17.0)

## 2017-07-08 MED ORDER — TAMSULOSIN HCL 0.4 MG PO CAPS: 0.4000 mg | ORAL_CAPSULE | Freq: Every day | ORAL | 11 refills | Status: DC

## 2017-07-08 MED ORDER — TAMSULOSIN HCL 0.4 MG PO CAPS: 0.4000 mg | ORAL_CAPSULE | Freq: Every day | ORAL | 11 refills | Status: AC

## 2017-07-08 NOTE — Progress Notes (Signed)
Patient ID: Frederick Duke, male   DOB: 05/19/1961, 56 y.o.   MRN: [id number]   1 Day Post-Op Subjective: Pt doing great.  Voided well without catheter and acceptable PVRs on tamsulosin.  Objective: Vital signs in last 24 hours: Temp:  [97.6 F (36.4 C)-98.4 F (36.9 C)] 97.9 F (36.6 C) (11/30 0458) Pulse Rate:  [76-91] 91 (11/30 0458) Resp:  [13-18] 14 (11/30 0458) BP: (89-121)/(56-81) 94/64 (11/30 0500) SpO2:  [99 %-100 %] 99 % (11/30 0458) Weight:  [56.2 kg (124 lb)-56.6 kg (124 lb 12.8 oz)] 56.2 kg (124 lb) (11/29 1214)  Intake/Output from previous day: 11/29 0701 - 11/30 0700 In: 1330 [P.O.:480; I.V.:800; IV Piggyback:50] Out: 1469 [Urine:1344; Blood:25] Intake/Output this shift: No intake/output data recorded.  Physical Exam:  General: Alert and oriented Back: Site looks good with minimal drainage.  Lab Results: Recent Labs    07/07/17 1212 07/07/17 1543 07/08/17 0427  HGB 9.2* 8.2* 8.4*  HCT 28.6* 25.1* 25.5*   BMET Recent Labs    07/07/17 1212 07/08/17 0427  NA 139 140  K 3.8 4.4  CL 106 109  CO2 26 24  GLUCOSE 102* 147*  BUN 21* 19  CREATININE 0.98 0.93  CALCIUM 8.9 8.7*     L safety catheter removed.  Assessment/Plan: D/C home   LOS: 0 days   Ledell Codrington,LES 07/08/2017, 8:16 AM

## 2017-07-08 NOTE — Discharge Summary (Signed)
  Date of admission: 07/07/2017  Date of discharge: 07/08/2017  Admission diagnosis: left renal calculi  Discharge diagnosis: left renal calculi  History and Physical: For full details, please see admission history and physical. Briefly, Frederick Duke is a 56 y.o. year old patient with large burden left renal calculi. He underwent a first stage percutaneous nephrolithotomy a little over one week ago.  This was complicated by intraoperative bleeding.  He follows up today for a second stage procedure   Hospital Course: he was taken to the operating room on 07/07/17 and underwent a second look percutaneous nephrolithotomy.  All remaining stone was removed.  A safety catheter was left in place.  He also hashad urinary retention recently.  He was given a voiding trial overnightand was voiding well by morning.  He remained stable and a safety catheter was removed.  He was able to be discharged home.  Laboratory values:  Recent Labs    07/07/17 1212 07/07/17 1543 07/08/17 0427  HGB 9.2* 8.2* 8.4*  HCT 28.6* 25.1* 25.5*   Recent Labs    07/07/17 1212 07/08/17 0427  CREATININE 0.98 0.93    Disposition: Home  Discharge instruction: The patient was instructed to be ambulatory but told to refrain from heavy lifting, strenuous activity, or driving.  Discharge medications:  Allergies as of 07/08/2017   No Known Allergies     Medication List    STOP taking these medications   cephALEXin 500 MG capsule Commonly known as:  KEFLEX     TAKE these medications   oxyCODONE-acetaminophen 5-325 MG tablet Commonly known as:  ROXICET Take 1 tablet every 4 (four) hours as needed by mouth for severe pain.   tamsulosin 0.4 MG Caps capsule Commonly known as:  FLOMAX Take 1 capsule (0.4 mg total) by mouth daily.       Followup:  Follow-up Information    Heloise PurpuraBorden, Shadow Stiggers, MD.   Specialty:  Urology Why:  07/12/17 as scheduled. Contact information: [address] [place]  [phone]

## 2017-07-08 NOTE — Progress Notes (Signed)
Went over all discharge information with patient and family.  All questions answered.  VSS.  Prescription and AVS given to patient.

## 2017-07-10 NOTE — Anesthesia Postprocedure Evaluation (Signed)
Anesthesia Post Note  Patient: Frederick Duke  Procedure(s) Performed: NEPHROLITHOTOMY/ SECOND STAGE (Left Back)     Patient location during evaluation: PACU Anesthesia Type: General Level of consciousness: awake and alert Pain management: pain level controlled Vital Signs Assessment: post-procedure vital signs reviewed and stable Respiratory status: spontaneous breathing, nonlabored ventilation, respiratory function stable and patient connected to nasal cannula oxygen Cardiovascular status: blood pressure returned to baseline and stable Postop Assessment: no apparent nausea or vomiting Anesthetic complications: no    Last Vitals:  Vitals:   07/08/17 0458 07/08/17 0500  BP: (!) 89/70 94/64  Pulse: 91   Resp: 14   Temp: 36.6 C   SpO2: 99%     Last Pain:  Vitals:   07/08/17 0900  TempSrc:   PainSc: 0-No pain                 [name] EDWARD

## 2017-12-25 IMAGING — XA IR URETURAL STENT LEFT NEW ACCESS W/O SEP NEPHROSTOMY CATH
8 series · 8 of 8 positions shown · non-contrast
Comparison: None.

INDICATION: 56-year-old with left renal calculi. A left ureter stent was placed
due to an obstructing UPJ stone. Patient presents for percutaneous
nephrolithotomy procedure and needs percutaneous access.

EXAM:
PLACEMENT OF LEFT NEPHROURETERAL CATHETER WITH ULTRASOUND AND
FLUOROSCOPIC GUIDANCE

[Series 1: care single · 1 of 1 slices shown (1 of 7)]
[im 1/1]
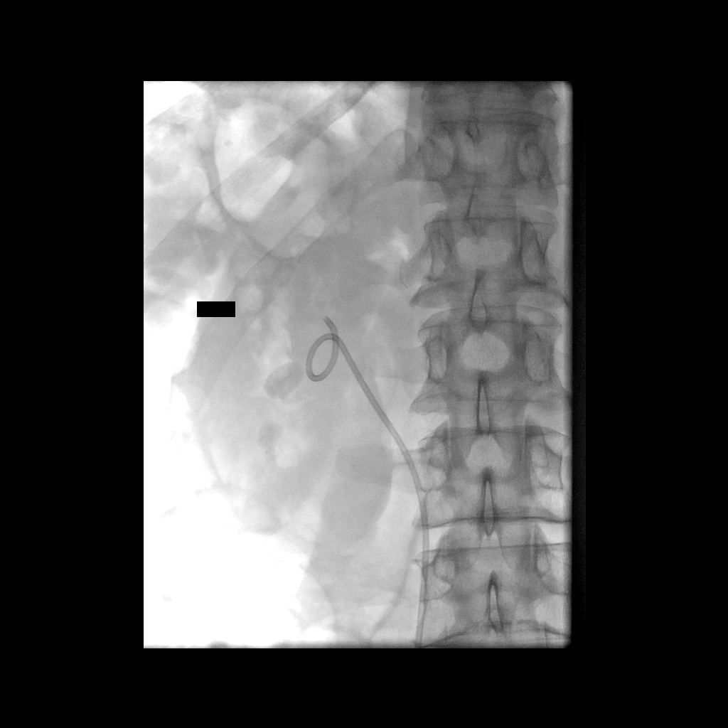

[Series 2: care single · 1 of 1 slices shown (2 of 7)]
[im 1/1]
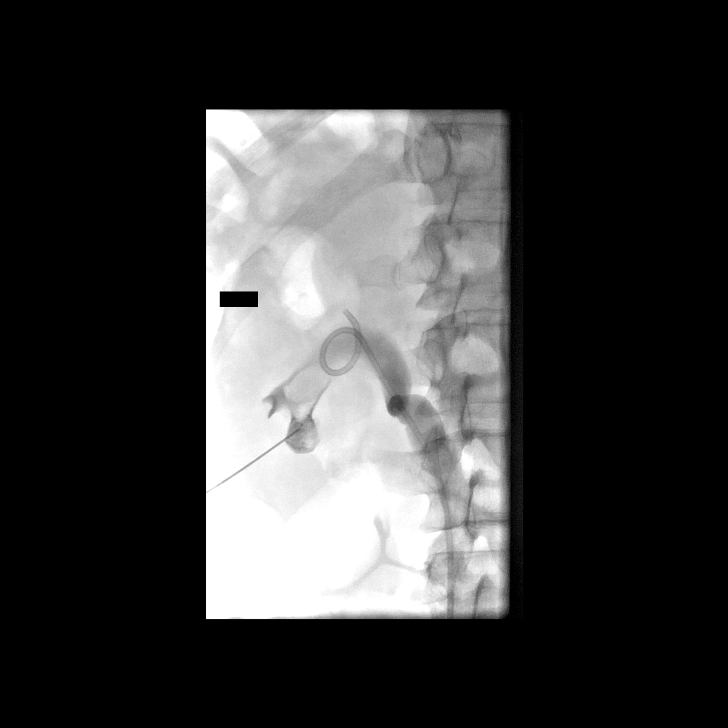

[Series 3: care single · 1 of 1 slices shown (3 of 7)]
[im 1/1]
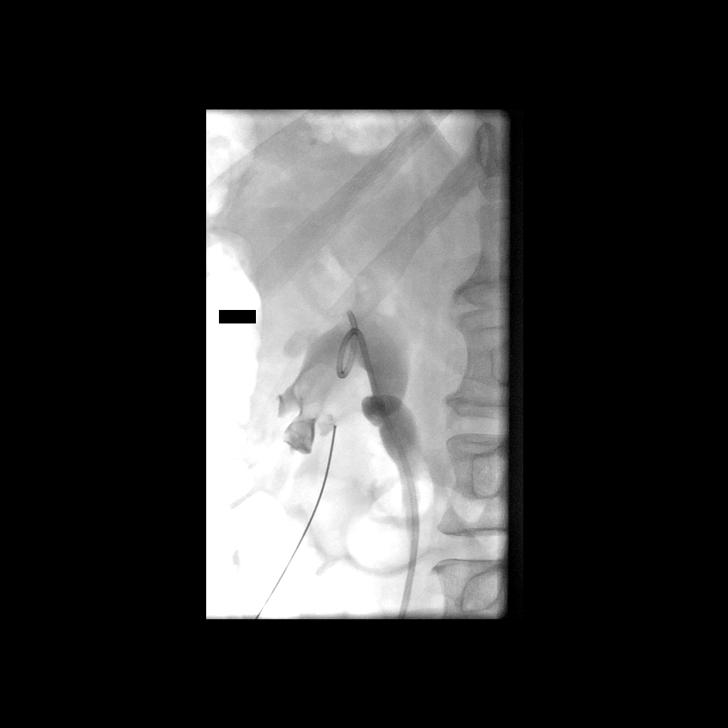

[Series 4: care single · 1 of 1 slices shown (4 of 7)]
[im 1/1]
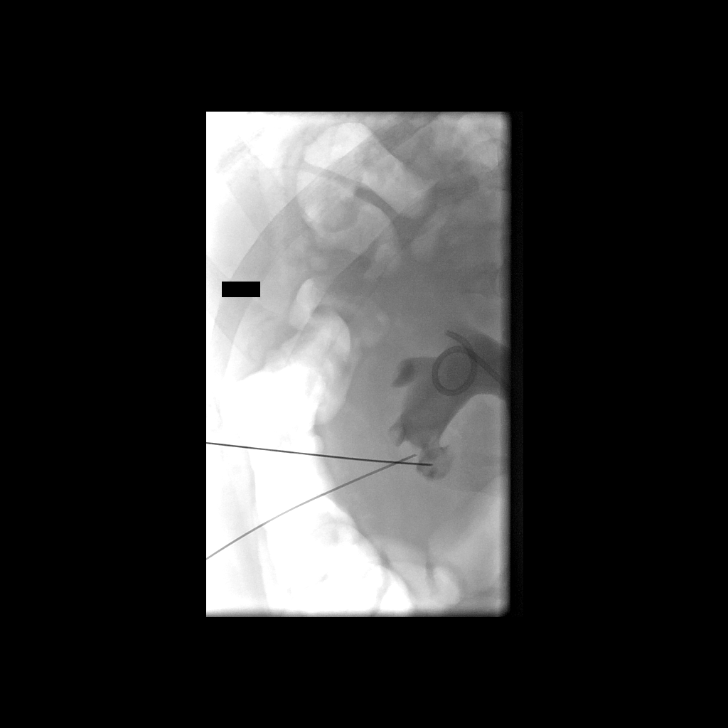

[Series 5: care single · 1 of 1 slices shown (5 of 7)]
[im 1/1]
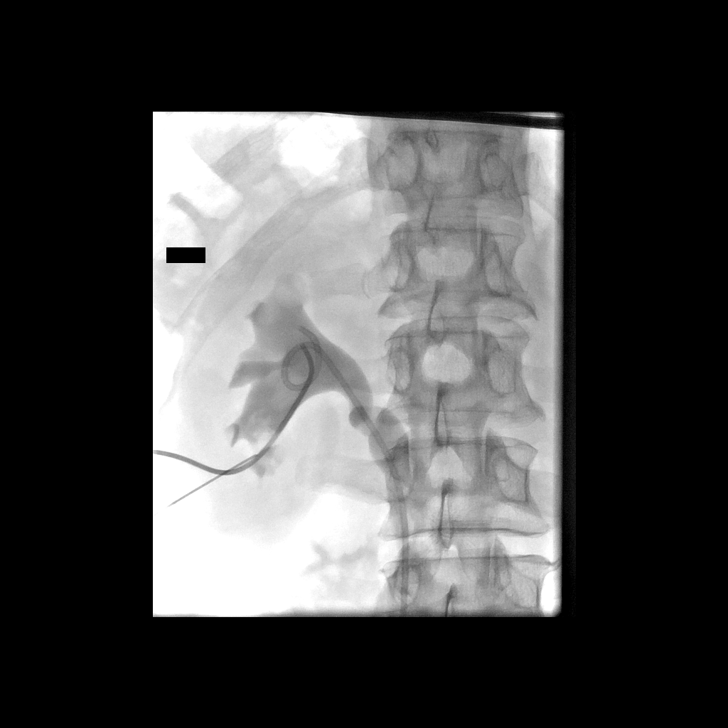

[Series 6: care single · 1 of 1 slices shown (6 of 7)]
[im 1/1]
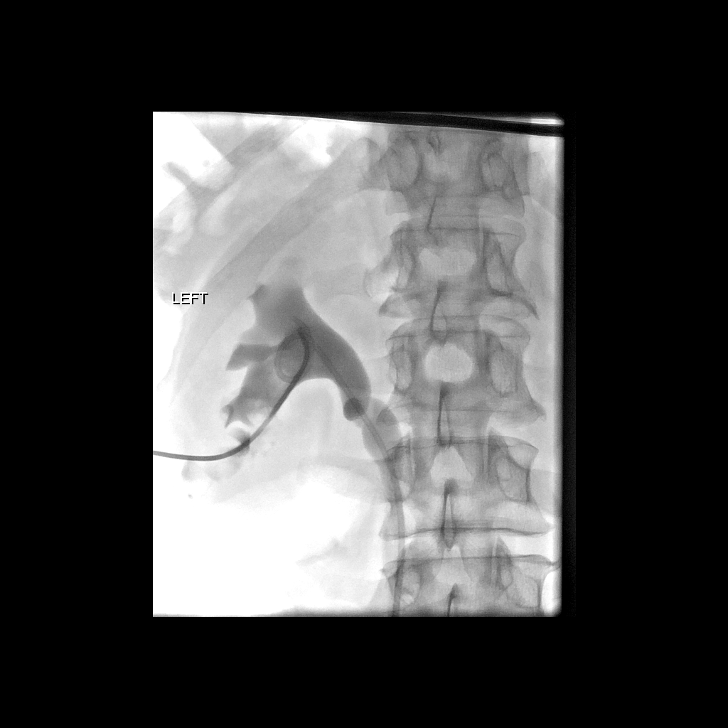

[Series 8: care single · 1 of 1 slices shown (7 of 7)]
[im 1/1]
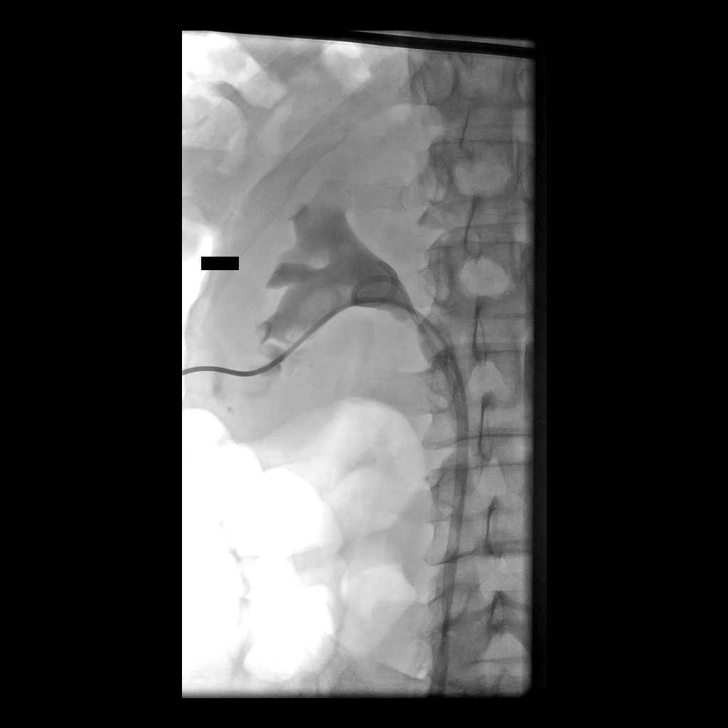

[Series 300: ir ureteral stent left new access w/o se · non-contrast · 1 of 1 slices shown]
[im 1/1]
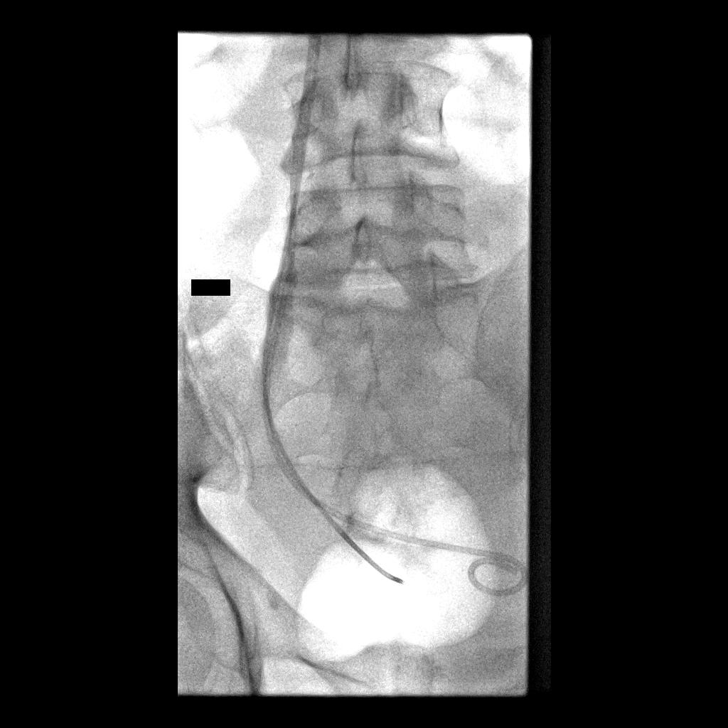

[8 of 8 positions shown; findings below may reference images not displayed]

MEDICATIONS:
Ciprofloxacin 400 mg; The antibiotic was administered in an
appropriate time frame prior to skin puncture.

ANESTHESIA/SEDATION:
Fentanyl 100 mcg IV; Versed 2.0 mg IV

Moderate Sedation Time:  33 minutes

The patient was continuously monitored during the procedure by the
interventional radiology nurse under my direct supervision.

CONTRAST:  15 mL - administered into the collecting system(s)

FLUOROSCOPY TIME:  Fluoroscopy Time: 7 minutes 30 seconds (36 mGy).

COMPLICATIONS:
None immediate.

PROCEDURE:
Informed written consent was obtained from the patient after a
thorough discussion of the procedural risks, benefits and
alternatives. All questions were addressed. Maximal Sterile Barrier
Technique was utilized including caps, mask, sterile gowns, sterile
gloves, sterile drape, hand hygiene and skin antiseptic. A timeout
was performed prior to the initiation of the procedure.

Patient was placed prone. Left flank was prepped and draped in a
sterile fashion. Fluoroscopic image of the left kidney was obtained.
Left kidney was evaluated with ultrasound. Skin and subcutaneous
tissues were anesthetized with 1% lidocaine. Using ultrasound
guidance, mildly dilated lower pole calyx was targeted with a 22
gauge Chiba needle. Needle was directed into the calyx and contrast
was injected. The collecting system was opacified with contrast.
Fluoroscopy demonstrated that the stone filled calyx in the lower
pole was actually slightly anterior to the accessed calyx.
Therefore, the lower pole calyx containing the stone was targeted
with a second 22 gauge Chiba needle. Needle was directed into the
calyx with fluoroscopic guidance. Wire was advanced into the calyx.
An Accustick dilator set was placed. Bentson wire was advanced into
the renal pelvis. Accustick dilator set was exchanged for a 5 French
Kumpe catheter. Additional nephrostogram images were obtained
through the Kumpe catheter. Catheter was advanced into the urinary
bladder with a Bentson wire. Catheter was sutured to the skin and
flushed with normal saline.

Fluoroscopic and ultrasound images were taken and saved for
documentation.
FINDINGS: Two large stones in the lower portion of the left kidney. Largest
stone appears to be in the lower pole infundibulum region. Access
was obtained from a lower pole calyx containing stone or stones.
Catheter successfully advanced into the urinary bladder.
IMPRESSION: Successful placement of a left percutaneous nephroureteral catheter
using ultrasound and fluoroscopic guidance.

## 2017-12-25 IMAGING — RF DG C-ARM GT 120 MIN-NO REPORT
1 series · 9 of 9 positions shown · non-contrast
Comparison: None.

CLINICAL DATA: Fluoroscopy was utilized by the requesting
physician. No radiographic interpretation.

EXAM:
DG C-ARM GT 120 MIN-NO REPORT

[Series 1: run · 9 of 9 slices shown]
[im 1/9]
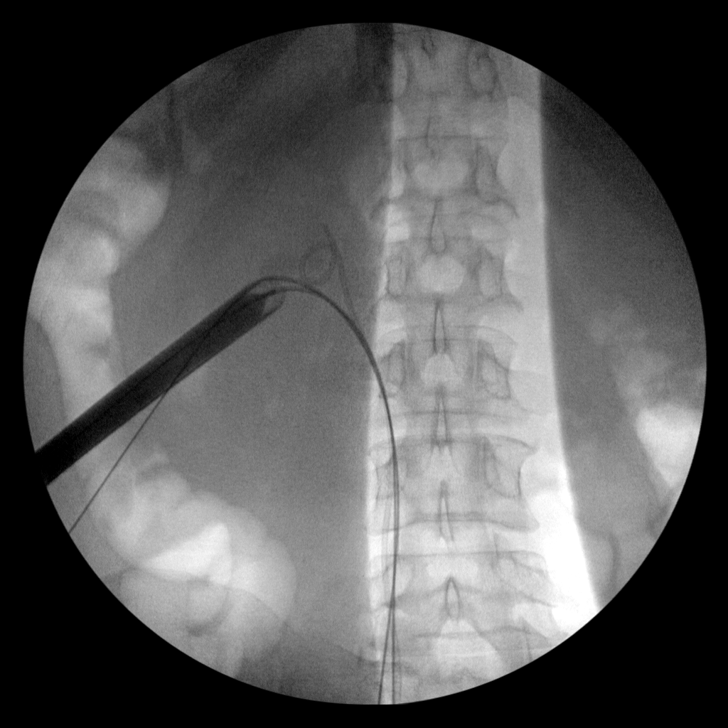
[im 2/9]
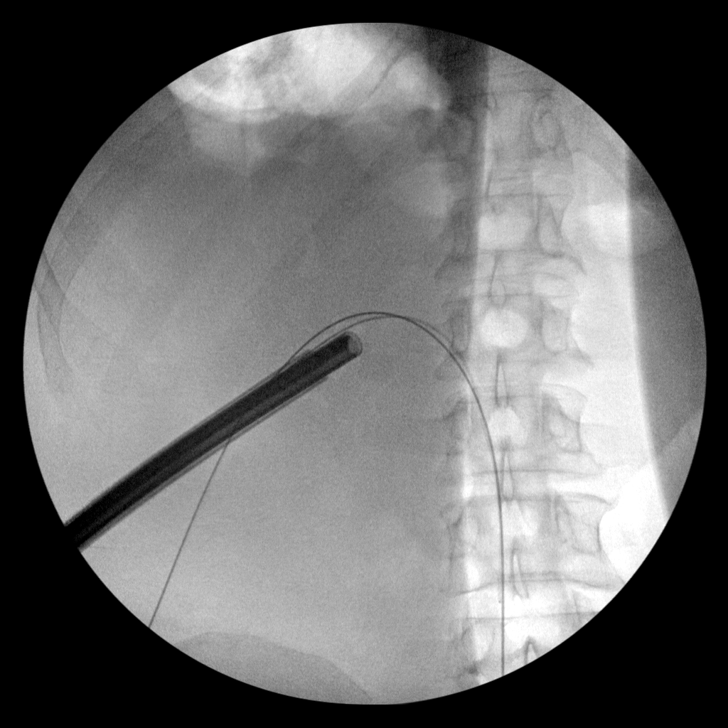
[im 3/9]
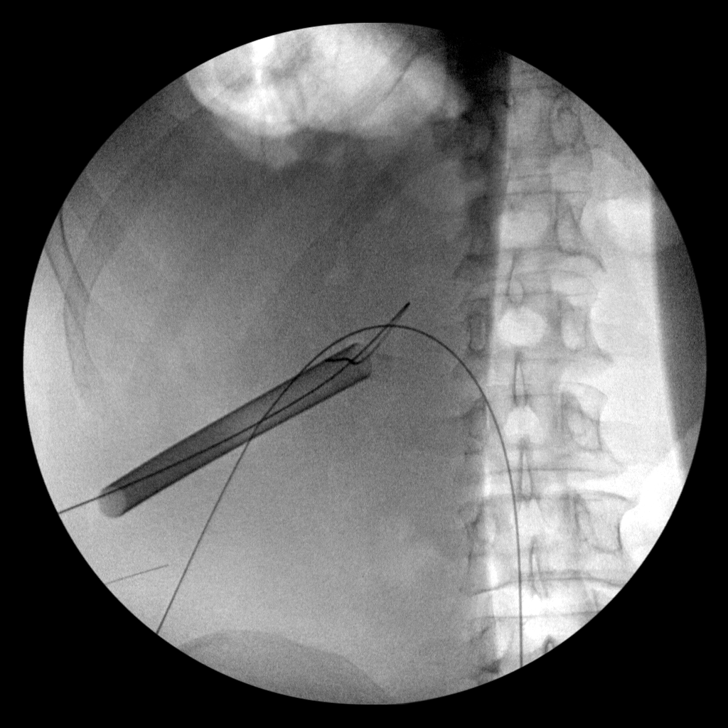
[im 4/9]
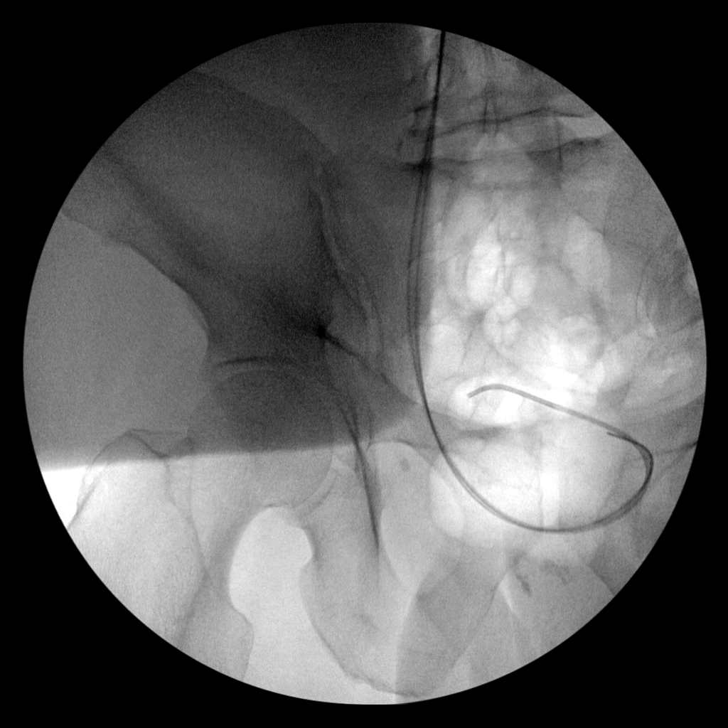
[im 5/9]
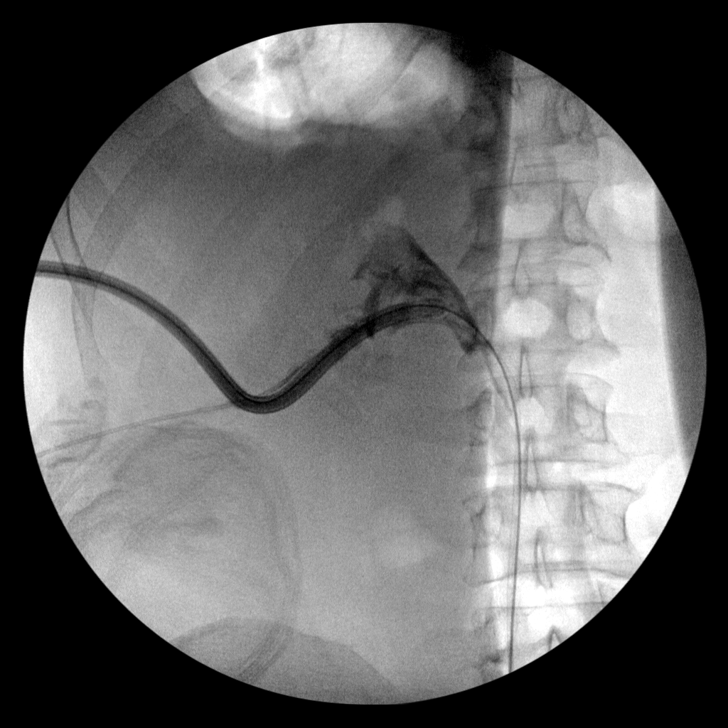
[im 6/9]
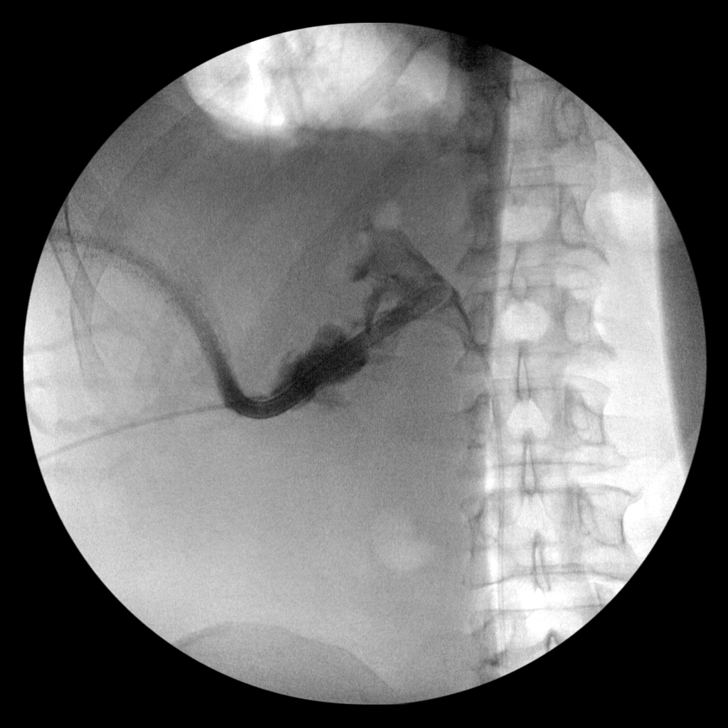
[im 7/9]
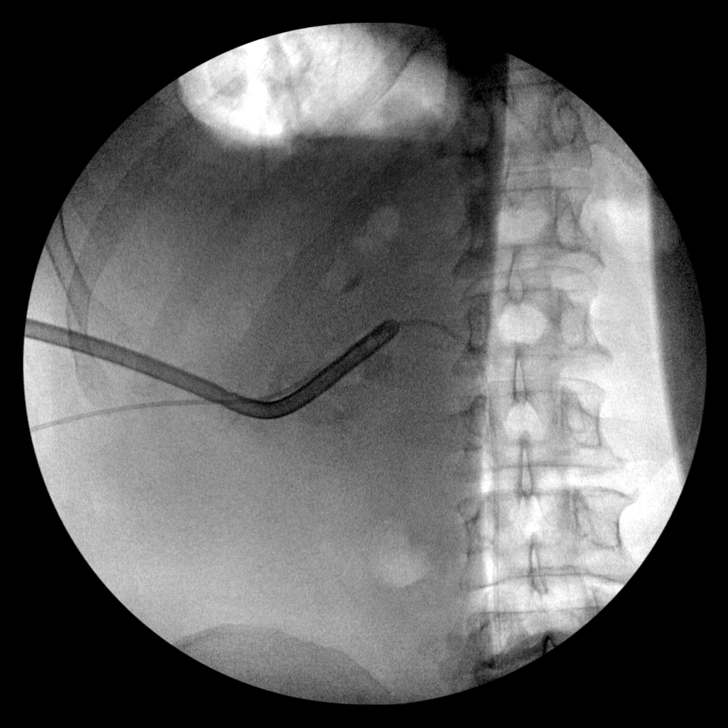
[im 8/9]
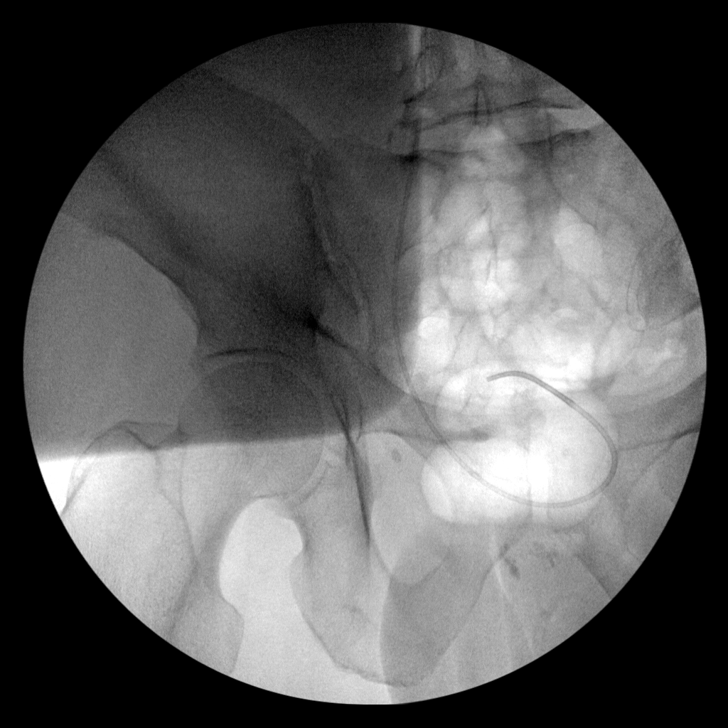
[im 9/9]
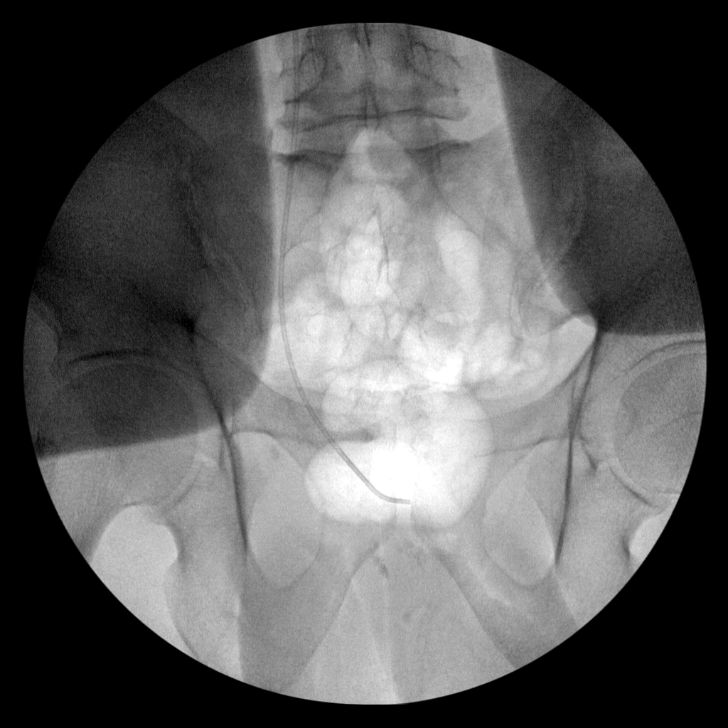

[9 of 9 positions shown; findings below may reference images not displayed]

FLUOROSCOPY TIME:  Fluoroscopy Time:

Radiation Exposure Index (if provided by the fluoroscopic device):

Number of Acquired Spot Images: 0
